# Patient Record
Sex: Female | Born: 1965 | Race: Black or African American | Hispanic: No | Marital: Single | Smoking: Current every day smoker
Health system: Southern US, Community
[De-identification: ages and names within clinical notes are randomized; demographics above are authoritative.]

## PROBLEM LIST (undated history)

## (undated) DIAGNOSIS — N760 Acute vaginitis: Secondary | ICD-10-CM

## (undated) DIAGNOSIS — D649 Anemia, unspecified: Secondary | ICD-10-CM

## (undated) DIAGNOSIS — B9689 Other specified bacterial agents as the cause of diseases classified elsewhere: Secondary | ICD-10-CM

## (undated) HISTORY — DX: Anemia, unspecified: D64.9

## (undated) HISTORY — PX: TUBAL LIGATION: SHX77

---

## 2003-05-25 ENCOUNTER — Emergency Department (HOSPITAL_COMMUNITY): Admission: EM | Admit: 2003-05-25 | Discharge: 2003-05-25 | Payer: Self-pay | Admitting: Emergency Medicine

## 2003-06-28 ENCOUNTER — Emergency Department (HOSPITAL_COMMUNITY): Admission: EM | Admit: 2003-06-28 | Discharge: 2003-06-29 | Payer: Self-pay

## 2004-04-28 ENCOUNTER — Emergency Department (HOSPITAL_COMMUNITY): Admission: EM | Admit: 2004-04-28 | Discharge: 2004-04-28 | Payer: Self-pay | Admitting: Emergency Medicine

## 2004-06-05 ENCOUNTER — Emergency Department (HOSPITAL_COMMUNITY): Admission: EM | Admit: 2004-06-05 | Discharge: 2004-06-05 | Payer: Self-pay | Admitting: Family Medicine

## 2004-10-05 ENCOUNTER — Emergency Department (HOSPITAL_COMMUNITY): Admission: EM | Admit: 2004-10-05 | Discharge: 2004-10-05 | Payer: Self-pay | Admitting: Family Medicine

## 2005-06-08 ENCOUNTER — Encounter: Payer: Self-pay | Admitting: Family Medicine

## 2005-06-08 ENCOUNTER — Encounter (INDEPENDENT_AMBULATORY_CARE_PROVIDER_SITE_OTHER): Payer: Self-pay | Admitting: Internal Medicine

## 2005-06-08 ENCOUNTER — Ambulatory Visit: Payer: Self-pay | Admitting: Family Medicine

## 2005-06-26 ENCOUNTER — Ambulatory Visit: Payer: Self-pay | Admitting: *Deleted

## 2005-11-14 ENCOUNTER — Ambulatory Visit: Payer: Self-pay | Admitting: Internal Medicine

## 2005-12-30 IMAGING — CT CT HEAD W/O CM
1 series · 16 of 30 positions shown, 20 images · non-contrast
Comparison: none

CLINICAL DATA: 38 year old female, headache, weakness.
CT HEAD WITHOUT CONTRAST ? 06/28/03
TECHNIQUE: Multidetector helical CT scanning obtained from the skull base to the vertex.

[Series 2: brain · axial · 0.47mm/px · z∈[+140,+274]mm · 16 of 30 slices shown, 20 images]
[im 2/30  brain]
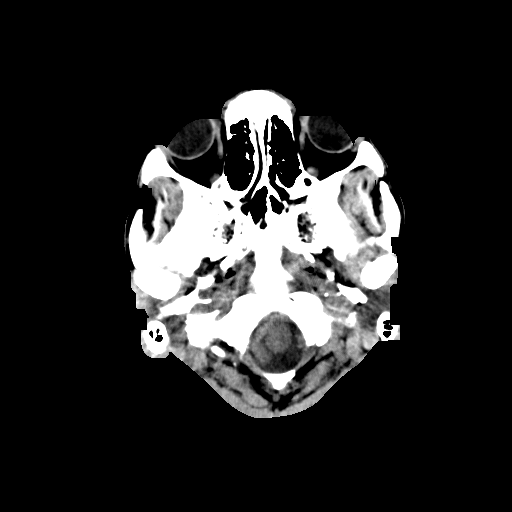
[im 2/30  bone]
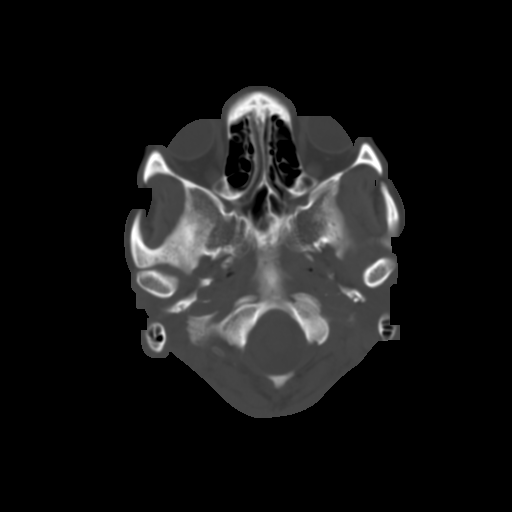
[im 4/30  brain]
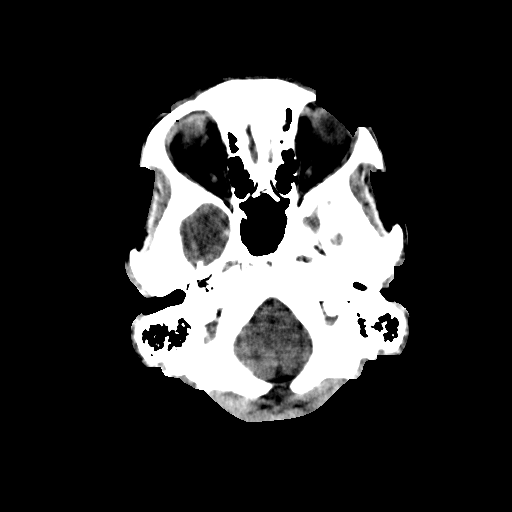
[im 6/30  brain]
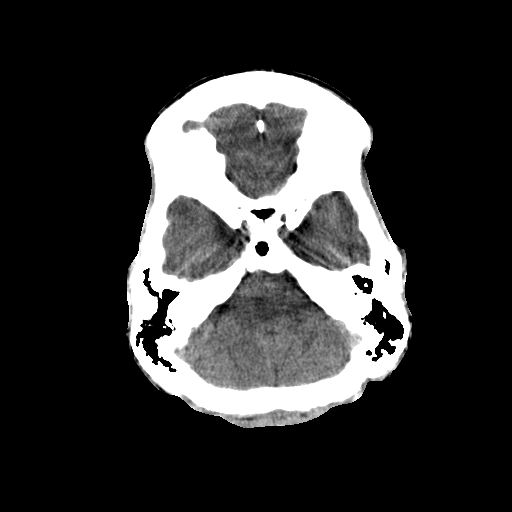
[im 8/30  brain]
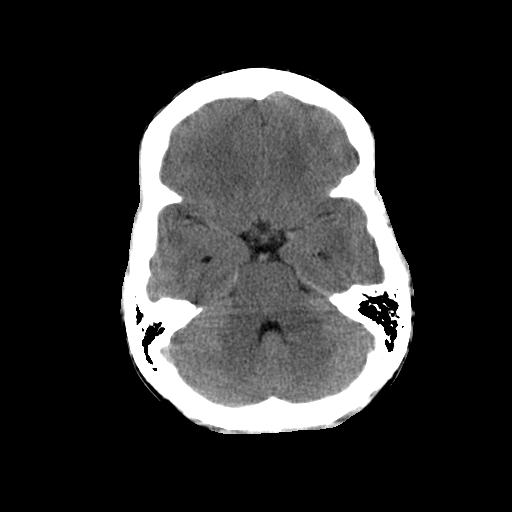
[im 9/30  brain]
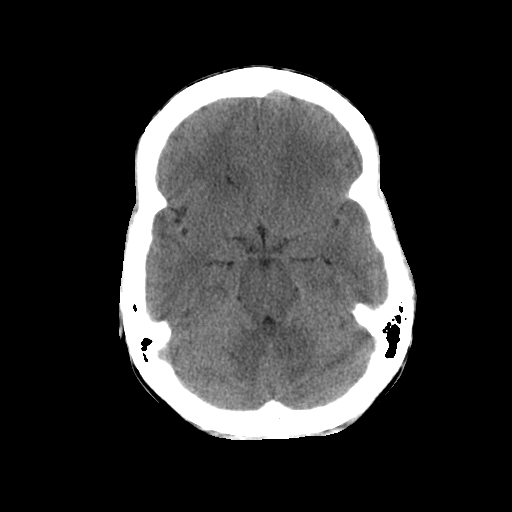
[im 9/30  bone]
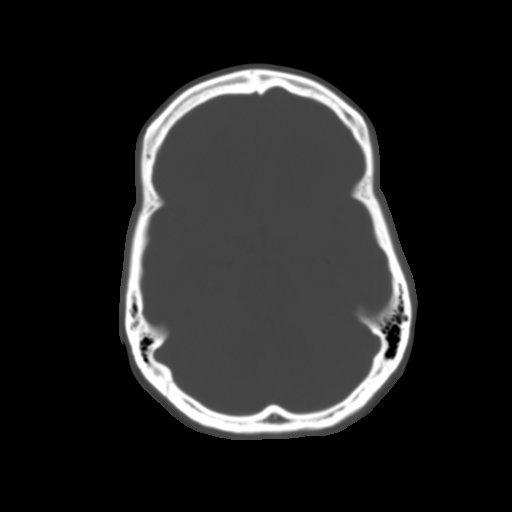
[im 11/30  brain]
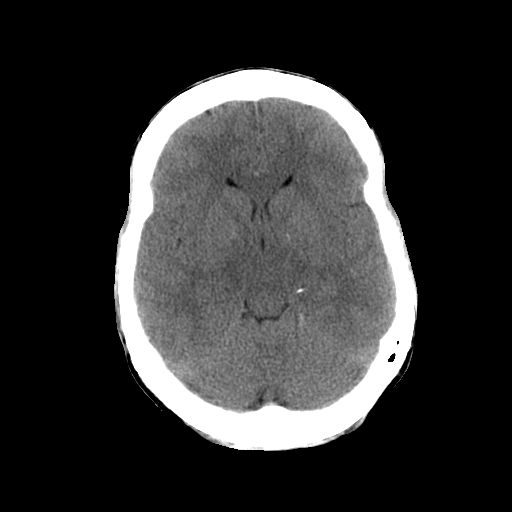
[im 13/30  brain]
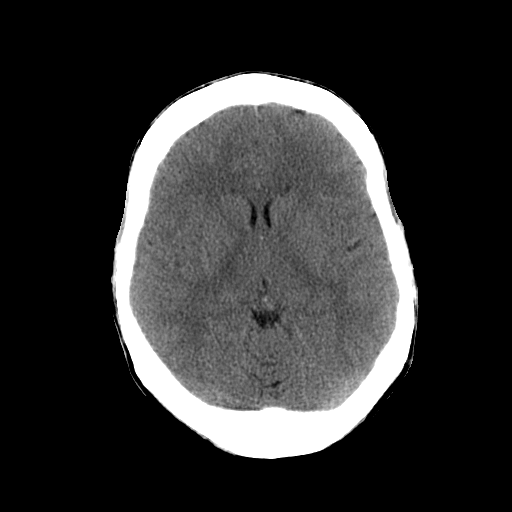
[im 15/30  brain]
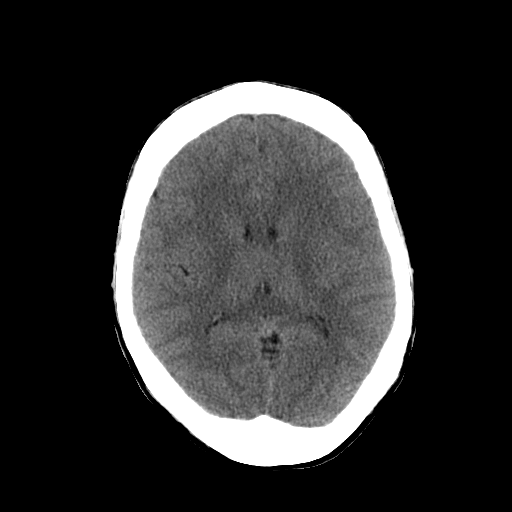
[im 16/30  brain]
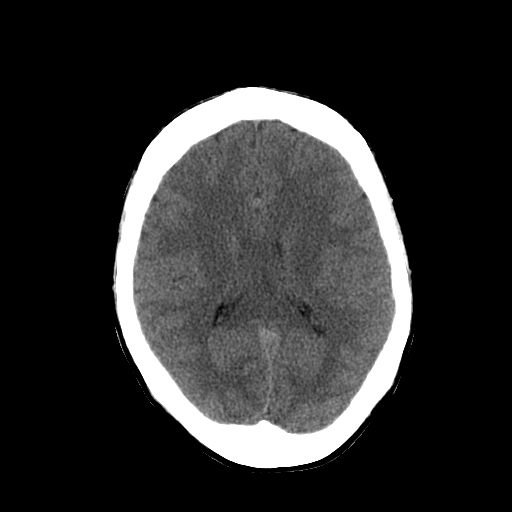
[im 16/30  bone]
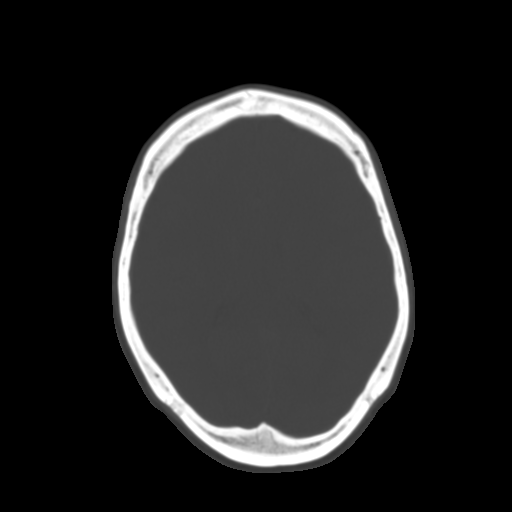
[im 18/30  brain]
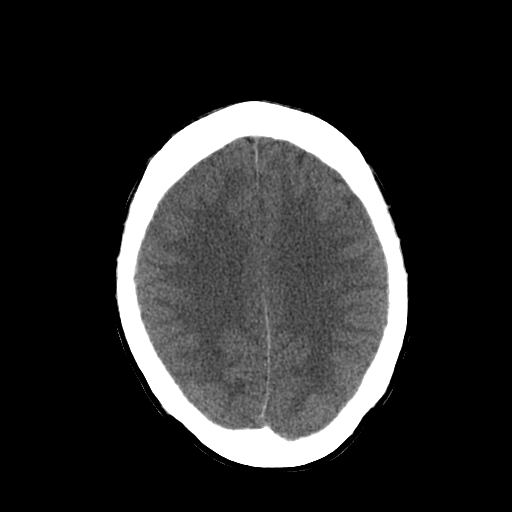
[im 20/30  brain]
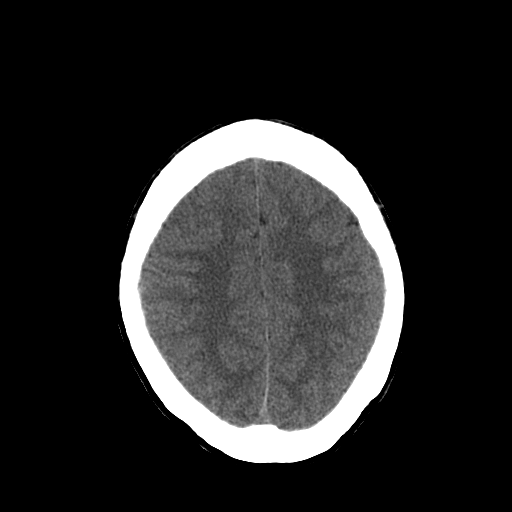
[im 22/30  brain]
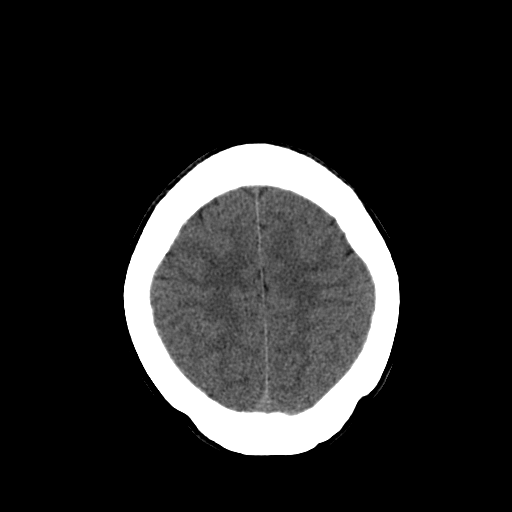
[im 23/30  brain]
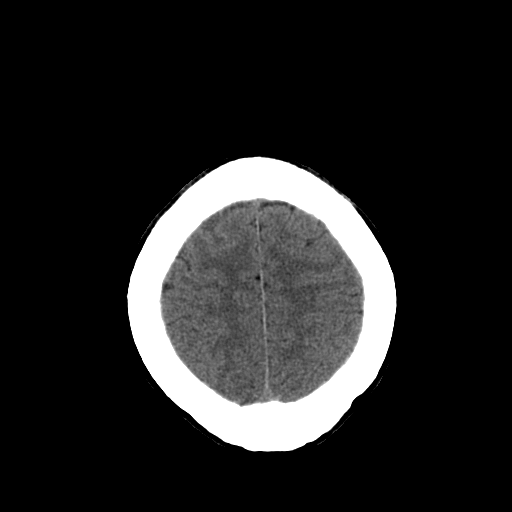
[im 23/30  bone]
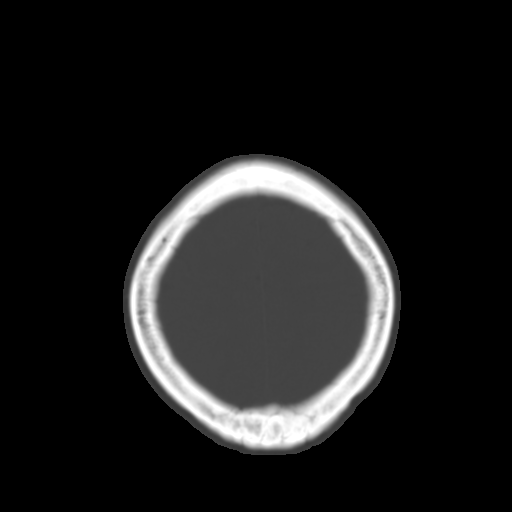
[im 25/30  brain]
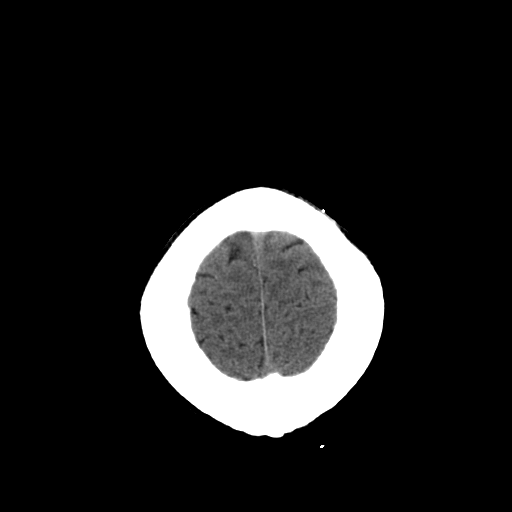
[im 27/30  brain]
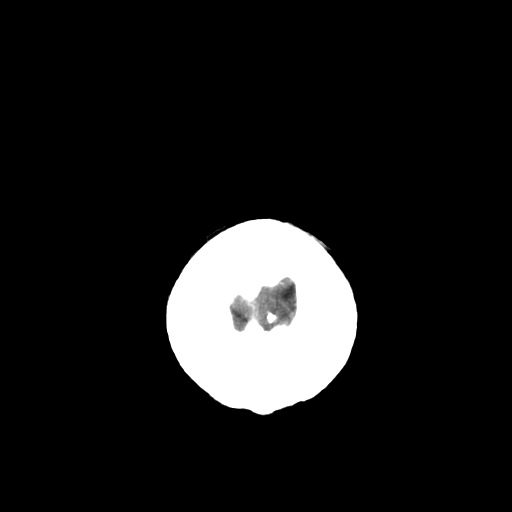
[im 29/30  brain]
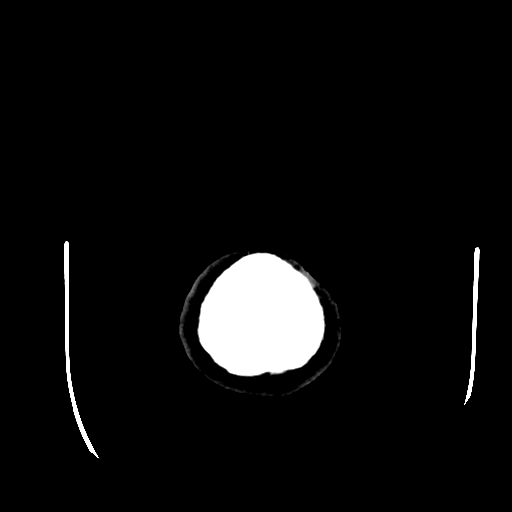

[16 of 30 positions shown; findings below may reference images not displayed]

FINDINGS: No acute intracranial abnormality is identified.  No evidence of mass or mass effect, hydrocephalus, extra-axial fluid collection, midline shift, hemorrhage, or infarct.  Acute infarct may be missed by CT for 24-48 hours.   Low lying cerebellar tonsils are noted.  The visualized bony calvarium and paranasal sinuses are unremarkable.
IMPRESSION
1.  No acute intracranial abnormality.
2.  Low lying cerebellar tonsils. Recommend further evaluation with MRI.

## 2006-04-20 ENCOUNTER — Emergency Department (HOSPITAL_COMMUNITY): Admission: EM | Admit: 2006-04-20 | Discharge: 2006-04-20 | Payer: Self-pay | Admitting: Emergency Medicine

## 2006-10-25 ENCOUNTER — Telehealth (INDEPENDENT_AMBULATORY_CARE_PROVIDER_SITE_OTHER): Payer: Self-pay | Admitting: Internal Medicine

## 2006-10-31 ENCOUNTER — Encounter (INDEPENDENT_AMBULATORY_CARE_PROVIDER_SITE_OTHER): Payer: Self-pay | Admitting: Internal Medicine

## 2006-10-31 DIAGNOSIS — F17200 Nicotine dependence, unspecified, uncomplicated: Secondary | ICD-10-CM

## 2006-11-02 DIAGNOSIS — Z8659 Personal history of other mental and behavioral disorders: Secondary | ICD-10-CM

## 2006-11-06 ENCOUNTER — Encounter (INDEPENDENT_AMBULATORY_CARE_PROVIDER_SITE_OTHER): Payer: Self-pay | Admitting: *Deleted

## 2008-10-05 ENCOUNTER — Emergency Department (HOSPITAL_COMMUNITY): Admission: EM | Admit: 2008-10-05 | Discharge: 2008-10-05 | Payer: Self-pay | Admitting: Family Medicine

## 2008-10-22 IMAGING — CR DG CHEST 2V
2 series · 2 of 2 positions shown · non-contrast
Comparison: none

CLINICAL DATA: Short of breath.
 CHEST ? 2 VIEW:

[w chest pa]
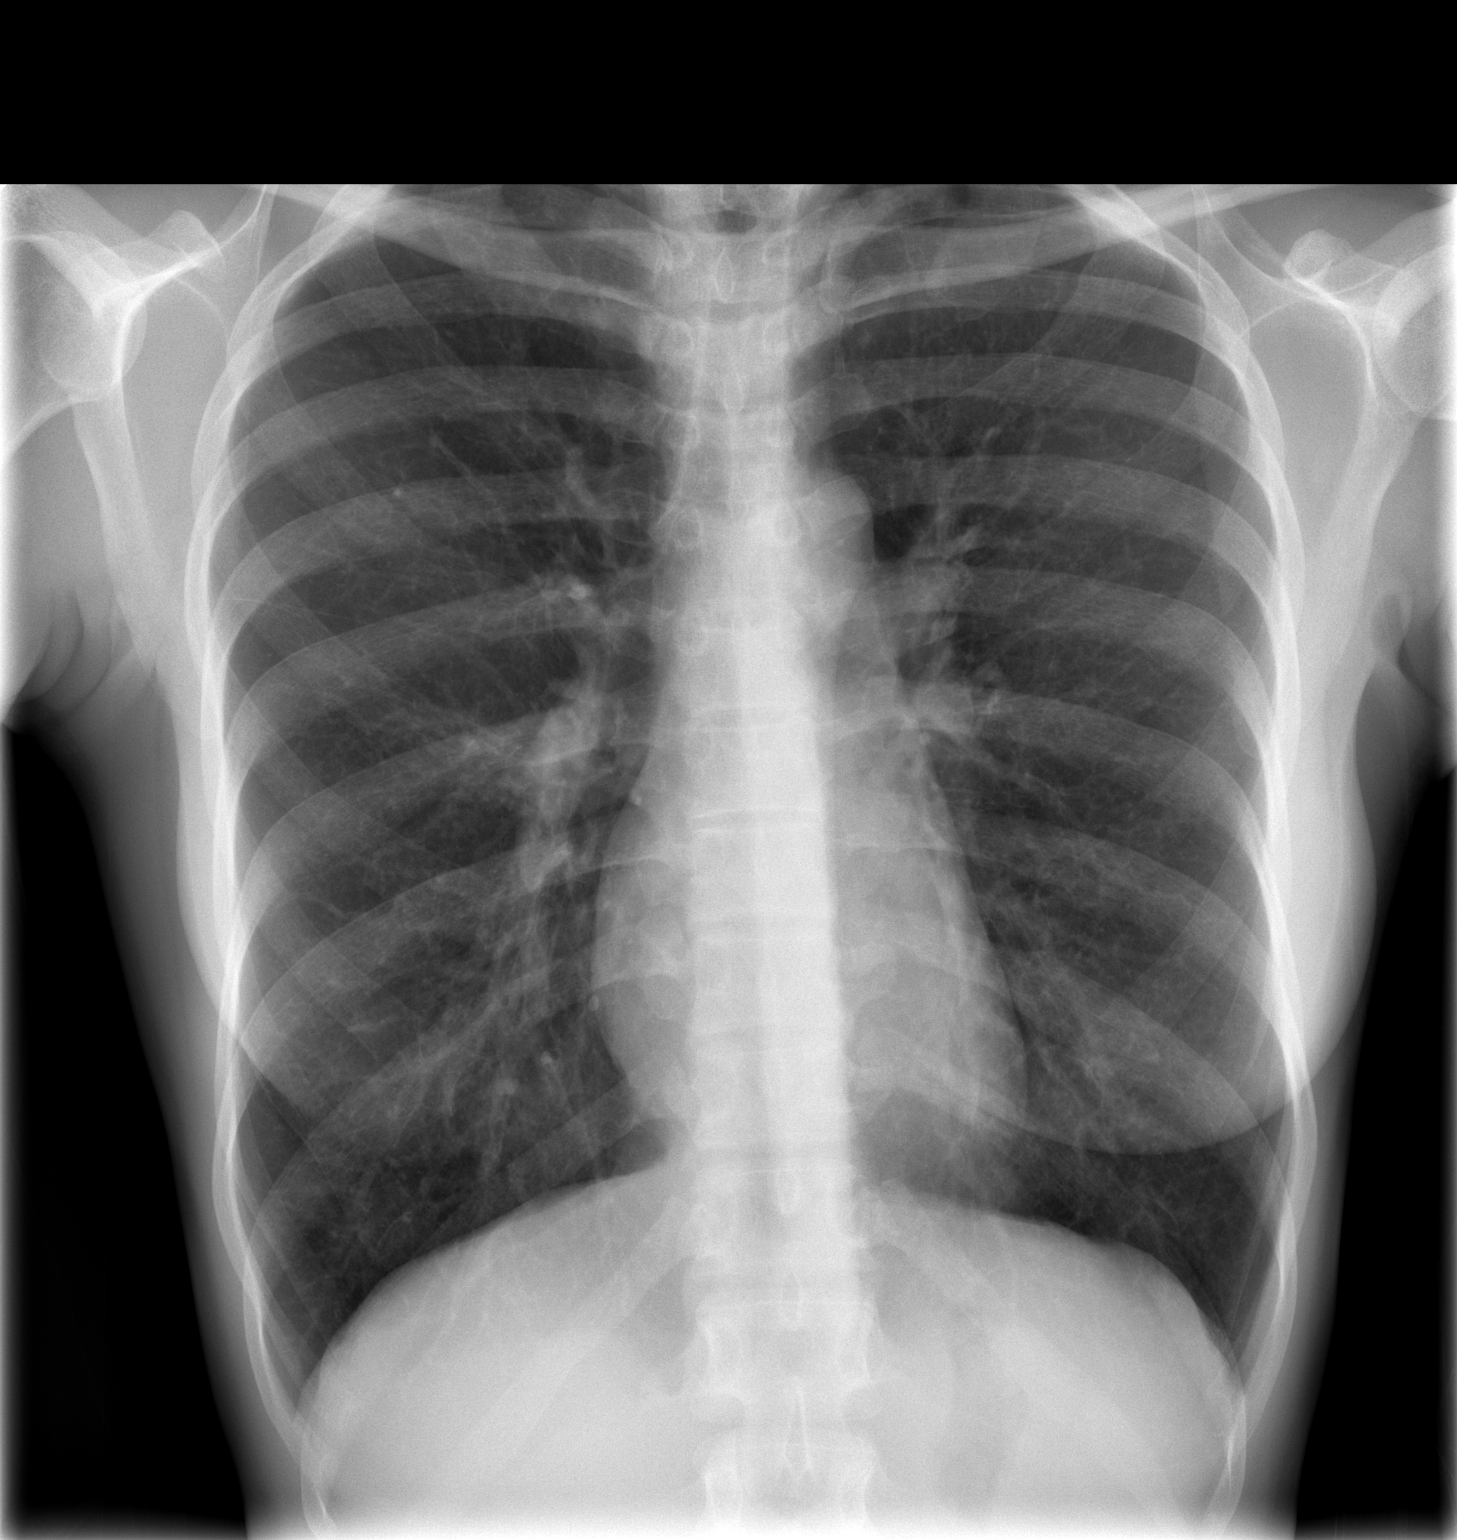

[w chest lat]
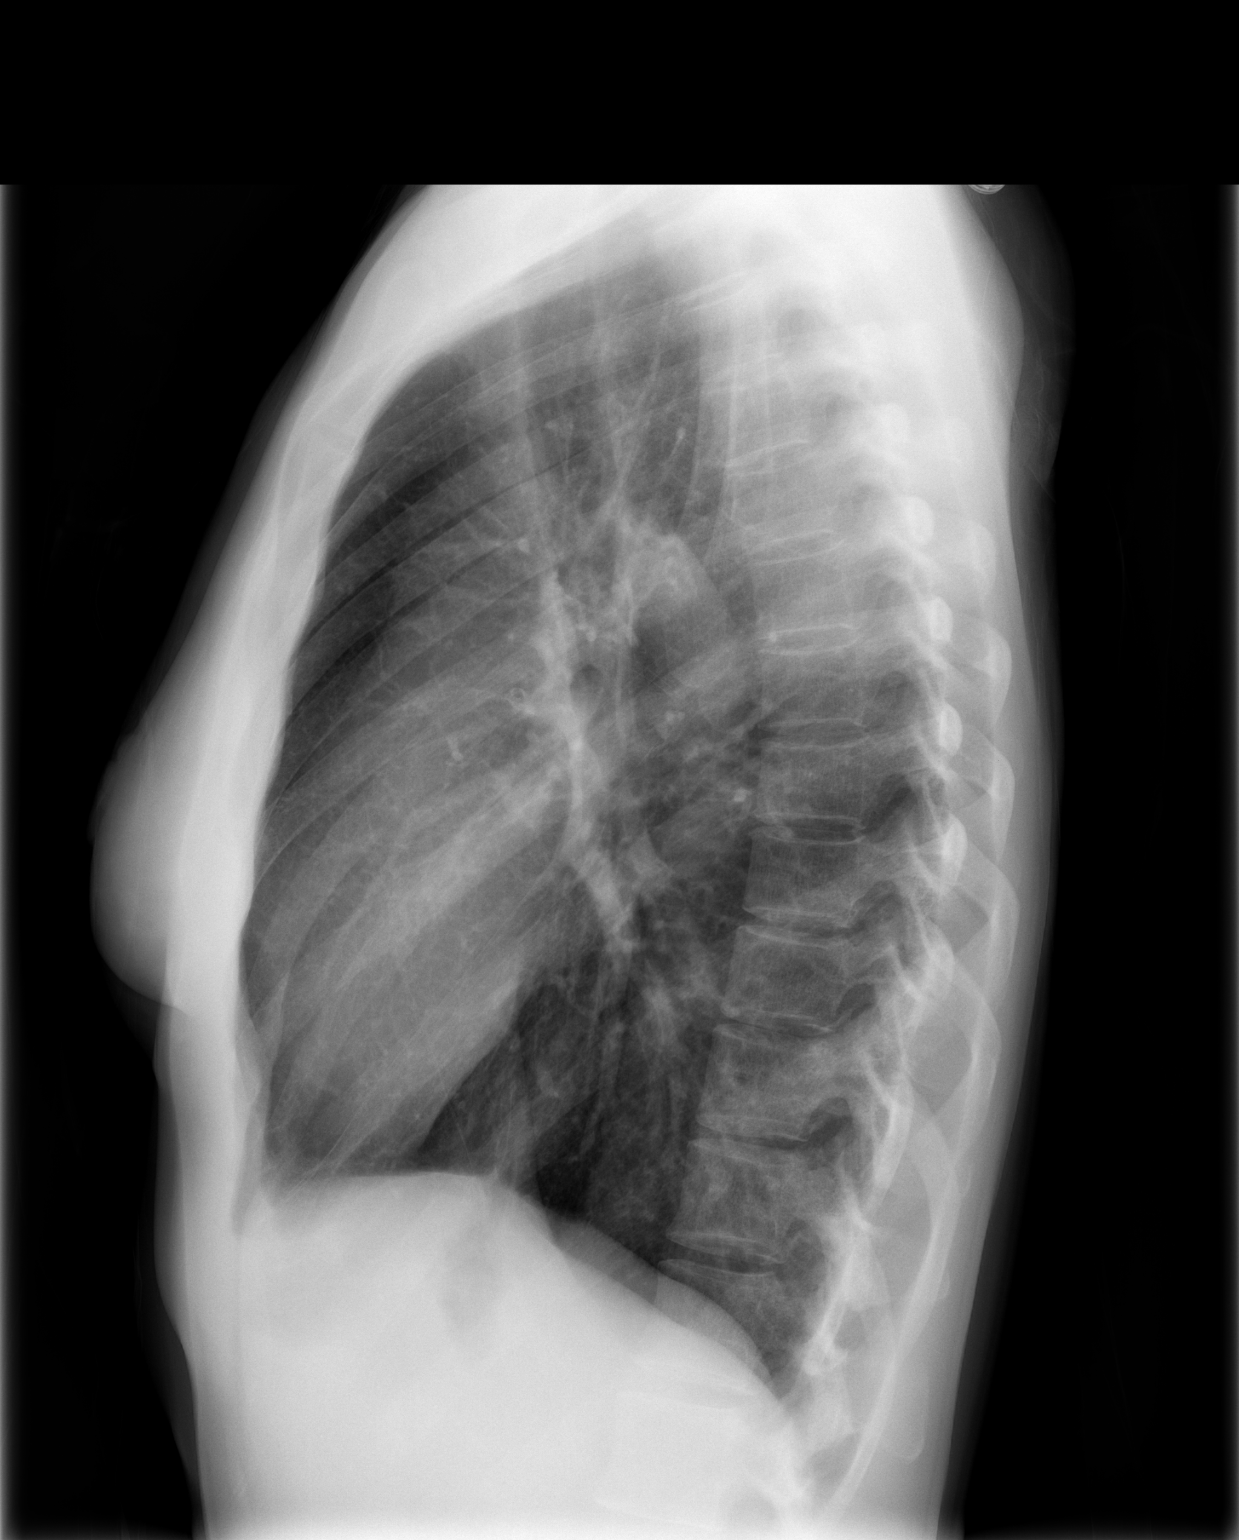

[2 of 2 positions shown; findings below may reference images not displayed]

FINDINGS: Bronchitic changes and hyperinflation are noted.  The heart is normal in size.  No consolidation.  No pneumothorax.
IMPRESSION: Hyperinflation and bronchitic changes.

## 2010-04-27 ENCOUNTER — Encounter (INDEPENDENT_AMBULATORY_CARE_PROVIDER_SITE_OTHER): Payer: Self-pay | Admitting: Nurse Practitioner

## 2010-04-27 ENCOUNTER — Encounter: Payer: Self-pay | Admitting: Nurse Practitioner

## 2010-04-27 DIAGNOSIS — L84 Corns and callosities: Secondary | ICD-10-CM | POA: Insufficient documentation

## 2010-04-27 DIAGNOSIS — K2970 Gastritis, unspecified, without bleeding: Secondary | ICD-10-CM | POA: Insufficient documentation

## 2010-04-27 DIAGNOSIS — K2990 Gastroduodenitis, unspecified, without bleeding: Secondary | ICD-10-CM | POA: Insufficient documentation

## 2010-04-27 DIAGNOSIS — K649 Unspecified hemorrhoids: Secondary | ICD-10-CM | POA: Insufficient documentation

## 2010-05-02 NOTE — Assessment & Plan Note (Signed)
Summary: Hemorrhoids   Vital Signs:  Patient profile:   45 year old female LMP:     03/16/2010 Weight:      114.3 pounds Temp:     98.2 degrees F oral Pulse rate:   87 / minute Pulse rhythm:   regular Resp:     20 per minute BP sitting:   92 / 62  (left arm) Cuff size:   regular  Vitals Entered By: Levon Hedger (April 27, 2010 2:25 PM) CC: stomach issue from hospital visit (gastritis)...Marland Kitchenhemrrhoid flare....callous on the bottom of foot Is Patient Diabetic? No Pain Assessment Patient in pain? no       Does patient need assistance? Functional Status Self care Ambulation Normal LMP (date): 03/16/2010     Enter LMP: 03/16/2010 Last PAP Result Normal   CC:  stomach issue from hospital visit (gastritis)...Marland Kitchenhemrrhoid flare....callous on the bottom of foot.  History of Present Illness:  Pt into the office to re-establish care. Pt not seen in over 2 years. Last hospital visit 10/05/2008 and pt was dx with gastritis.  She was not able to get the prescribed meds so she took over the counter prilosec which helped. She has changed diet to avoid citris foods, chocolate. Pt admits that back in 2010 she was drinking in excess but she has since decreased her drinking.  Pt reports that she has been to the health [facility] and dx with BV. started on metronidazole, which gave her a yeast infection and she took a medication. Pt reports that she also has some rectal itching - tried preparation H without resolution of the symtoms S/P  5 natural deliveries Reports that her stools are soft, denies any straining Denies any change in sexual relations - partner since the past 6 years  Callous - left foot; Pt has been using multple OTC preparations withtout resolutions of symptoms. ongoing for the past 1.5 years.  painful when walking  Habits & Providers  Alcohol-Tobacco-Diet     Alcohol drinks/day: <1     Alcohol Counseling: to decrease amount and/or frequency of alcohol intake  Alcohol type: wine     Tobacco Status: current     Tobacco Counseling: to quit use of tobacco products     Cigarette Packs/Day: 0.25  Exercise-Depression-Behavior     Does Patient Exercise: no     Have you felt down or hopeless? no     Have you felt little pleasure in things? no     Drug Use: never  Medications Prior to Update: 1)  None  Allergies (verified): No Known Drug Allergies  Social History: Smoking Status:  current Packs/Day:  0.25 Drug Use:  never Does Patient Exercise:  no  Review of Systems General:  Denies loss of appetite. CV:  Denies chest pain or discomfort. Resp:  Denies cough. GI:  Complains of hemorrhoids; denies abdominal pain, nausea, and vomiting.  Physical Exam  General:  alert.   Head:  normocephalic.   Eyes:  glasses Ears:  ear piercing(s) noted.   Neurologic:  alert & oriented X3.   Skin:  left foot - under 4th metatarsal; callous Psych:  Oriented X3.     Impression & Recommendations:  Problem # 1:  GASTRITIS (ICD-535.50)  Pt has decreased her ETOH use handout given on foods to avoid  Her updated medication list for this problem includes:    Prilosec Otc 20 Mg Tbec (Omeprazole magnesium) ..... One capsule by mouth daily for stomach  Problem # 2:  HEMORRHOIDS (ICD-455.6) cream  ordered will better assess during next CPE exam  Problem # 3:  TOBACCO ABUSE (ICD-305.1) advised cessation  Problem # 4:  CORNS AND CALLUSES (ICD-700)  Orders: Podiatry Referral (Podiatry)  Complete Medication List: 1)  Prilosec Otc 20 Mg Tbec (Omeprazole magnesium) .... One capsule by mouth daily for stomach 2)  Anusol-hc 2.5 % Crea (Hydrocortisone) .... Apply to affected area two times a day as needed for itching  Patient Instructions: 1)  Schedule an appointment for a complete physical exam 2)  Come fasting before this visit.  No food after midnight before this visit.  You will need PAP, rectal exam, Labs, vision, mammogram schedule 3)  You will be  referred to podiatry for your foot 4)  Hemorrhoids - be sure to keep your stools soft.  Will send [facility]) Prescriptions: ANUSOL-HC 2.5 % CREA (HYDROCORTISONE) Apply to affected area two times a day as needed for itching  #30gm x 0   Entered and Authorized by:   Lehman Prom FNP   Signed by:   Lehman Prom FNP on 04/27/2010   Method used:   Print then Give to Patient   RxID:   [id number]    Orders Added: 1)  Est. Patient Level IV [13086] 2)  Podiatry Referral [Podiatry]  Appended Document: Hemorrhoids    Clinical Lists Changes  Medications: Rx of ANUSOL-HC 2.5 % CREA (HYDROCORTISONE) Apply to affected area two times a day as needed for itching;  #30gm x 0;  Signed;  Entered by: Lehman Prom FNP;  Authorized by: Lehman Prom FNP;  Method used: Faxed to [facility], [address], [place], [place]  [address], Ph: [phone], Fax: [phone]    Prescriptions: ANUSOL-HC 2.5 % CREA (HYDROCORTISONE) Apply to affected area two times a day as needed for itching  #30gm x 0   Entered and Authorized by:   Lehman Prom FNP   Signed by:   Lehman Prom FNP on 04/27/2010   Method used:   Faxed to ...       [facility] (retail)       [address] [place], [place]  [address]       Ph: [phone]       Fax: [phone]   RxID:   [id number]

## 2010-05-02 NOTE — Letter (Signed)
Summary: Handout Printed  Printed Handout:  - Hemorrhoids 

## 2010-05-15 ENCOUNTER — Encounter (INDEPENDENT_AMBULATORY_CARE_PROVIDER_SITE_OTHER): Payer: Self-pay | Admitting: Internal Medicine

## 2010-05-23 NOTE — Letter (Signed)
Summary: PODIATRY NOTES  PODIATRY NOTES   Imported By: Arta Bruce 05/16/2010 14:56:55  _____________________________________________________________________  External Attachment:    Type:   Image     Comment:   External Document

## 2010-05-27 LAB — RAPID URINE DRUG SCREEN, HOSP PERFORMED
Amphetamines: NOT DETECTED
Benzodiazepines: NOT DETECTED
Cocaine: NOT DETECTED
Opiates: NOT DETECTED
Tetrahydrocannabinol: POSITIVE — AB

## 2010-05-27 LAB — HEPATIC FUNCTION PANEL
ALT: 10 U/L (ref 0–35)
AST: 17 U/L (ref 0–37)
Total Protein: 6.7 g/dL (ref 6.0–8.3)

## 2010-05-27 LAB — POCT I-STAT, CHEM 8
Calcium, Ion: 1.14 mmol/L (ref 1.12–1.32)
Creatinine, Ser: 0.9 mg/dL (ref 0.4–1.2)
HCT: 40 % (ref 36.0–46.0)
Sodium: 137 mEq/L (ref 135–145)
TCO2: 23 mmol/L (ref 0–100)

## 2010-05-27 LAB — SEDIMENTATION RATE: Sed Rate: 9 mm/hr (ref 0–22)

## 2010-06-23 ENCOUNTER — Other Ambulatory Visit: Payer: Self-pay | Admitting: Family Medicine

## 2010-07-07 NOTE — Consult Note (Signed)
Vicki Andrews, BHULLAR                            ACCOUNT NO.:  [id number]   MEDICAL RECORD NO.:  [id number]                   PATIENT TYPE:  EMS   LOCATION:  [facility]                                 FACILITY:  [facility]   PHYSICIAN:  Michael L. Thad Ranger, M.D.           DATE OF BIRTH:  1965-11-23   DATE OF CONSULTATION:  06/28/2003  DATE OF DISCHARGE:  06/29/2003                                   CONSULTATION   REQUESTING PHYSICIAN:  Lorre Nick, M.D.,  [facility].   REASON FOR CONSULTATION:  Headache and left-sided weakness.   HISTORY OF PRESENT ILLNESS:  This is the initial emergency room consultation  evaluation of this 45 year old woman with past medical history which  includes migraine and reported stroke in the past.  The patient reports at  about 7:30 in the evening yesterday the onset of severe headache which is  left-sided, throbbing, and associated with nausea and dry heaves.  Shortly  after that, she noted the left side felt numb and weak in the arm and leg  and noted that her speech became slurred.  She came to the emergency room  where her symptoms improved after receiving pain medications but not  resolved.  She says that she had similar symptoms about six years ago after  the delivery of her first child and was actually diagnosed with a stroke at  that time.  CT of the head was negative, and neurologic consultation was  requested.   PAST MEDICAL HISTORY:  1. Frequent chronic headaches with migrainous nature.  2. Asthma.  3. The reported stroke episode as above.   SOCIAL HISTORY:  She does smoke and continues to use alcohol.  She also  occasionally, according to the ER chart, uses marijuana.  She reports just  having moved to the area and does not have a primary physician.   FAMILY HISTORY:  Noncontributory.   ALLERGIES:  No known drug allergies.   MEDICATIONS:  Albuterol inhaler.   REVIEW OF SYSTEMS:  Remarkable for the numbness and weakness, nausea,  and  slurred speech as above, as well as headache as above.  Remainder 10-system  of Review of Systems negative except as outlined in ER records.   PHYSICAL EXAMINATION:  VITAL SIGNS:  Temperature 98.4, blood pressure  123/75, pulse 76, respirations 12.  GENERAL:  She is alert in no evident distress, a little bit indifferent to  her situation.  She has an unusual, slow, forced speech pattern which is  slurred but not truly dysarthric.  Her speech is appropriate in content.  She is able to name objects.  HEENT:  Head: Cranium normocephalic and atraumatic.  Oropharynx benign.  NECK:  Supple without carotid bruits.  HEART:  Regular rate and rhythm without murmurs.  NEUROLOGIC:  Funduscopic exam benign.  Pupils equal and briskly reactive.  Extraocular movements full without nystagmus.  Visual fields full to  confrontation.  Hearing  is intact to conversational speech.  Facial  sensation is intact to pinprick.  Face, tongue, and palate move normally and  symmetrically.  Shoulder shrug strength is normal.  Motor testing:  Normal  bulk and tone.  Strength on the right is normal.  On the left, there is  giveaway but at least brief full strength can be generated in all major  extremity muscle groups and giveaway weakness is present. Sensation intact  to light touch and pinprick all extremities.  Coordination: Performed slow  on the left.  Cerebellar exam demonstrates no true dysmetria.  Gait exam  deferred.  Reflexes 2+ and symmetric.  Toes are downgoing.   LABORATORY AND X-RAY REVIEW:  CMET is unremarkable except for minimally low  potassium at 3.4.  CBC is unremarkable.  Coags are normal.   CT of the head is personally reviewed and is normal except for question of  somewhat low-lying __________possible Chiari malformation.   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  Symptoms are improving as her headache has improved.  She does have exam  findings which are rather  exaggerated.   RECOMMENDATIONS:  Would advance this expectantly.  If symptoms are not  resolved by the morning, it would be appropriate to check an MRI.  I suggest  that she take an aspirin a day for prophylaxis and follow up in our office  for consideration of migraine prophylaxis which hopefully would reduce the  number of total headaches and headaches associated with a neurologic  deficit.  She also needs to establish a primary care physician.                                               Michael L. Thad Ranger, M.D.    MLR/MEDQ  D:  06/29/2003  T:  06/29/2003  Job:  [id number]

## 2011-07-06 ENCOUNTER — Encounter (HOSPITAL_COMMUNITY): Payer: Self-pay | Admitting: Emergency Medicine

## 2011-07-06 ENCOUNTER — Emergency Department (INDEPENDENT_AMBULATORY_CARE_PROVIDER_SITE_OTHER)
Admission: EM | Admit: 2011-07-06 | Discharge: 2011-07-06 | Disposition: A | Discharge status: Home / Self Care | Payer: Self-pay | Source: Home / Self Care | Attending: Emergency Medicine | Admitting: Emergency Medicine

## 2011-07-06 DIAGNOSIS — W57XXXA Bitten or stung by nonvenomous insect and other nonvenomous arthropods, initial encounter: Secondary | ICD-10-CM

## 2011-07-06 DIAGNOSIS — B356 Tinea cruris: Secondary | ICD-10-CM

## 2011-07-06 DIAGNOSIS — T148 Other injury of unspecified body region: Secondary | ICD-10-CM

## 2011-07-06 MED ORDER — HYDROXYZINE HCL 25 MG PO TABS: 25.0000 mg | ORAL_TABLET | Freq: Four times a day (QID) | ORAL | Status: AC | PRN

## 2011-07-06 MED ORDER — PREDNISONE 20 MG PO TABS: 60.0000 mg | ORAL_TABLET | Freq: Every day | ORAL | Status: AC

## 2011-07-06 MED ORDER — FLUCONAZOLE 150 MG PO TABS: ORAL_TABLET | ORAL | Status: AC

## 2011-07-06 NOTE — ED Notes (Addendum)
Pt stated she was diagnosed with eczema on her back and abdomen about a year ago. She states the medicine given never really helped much and now the rash has spread to her genital area. Pt also has a new rash that is red raised bumps that both itch and hurt on all extremeties. These bumps started about a week ago and no OTC meds that she has tried have helped.

## 2011-07-06 NOTE — Discharge Instructions (Signed)
You may continue the Lamisil, but you needs to apply in one to 2 times a day until the rash resolves and for one week thereafter. The Diflucan should take care of the infection entirely. Return if you've a fever above 100.4, if you get worse, or for any other concerns

## 2011-07-08 NOTE — ED Provider Notes (Signed)
History     CSN: [id number]  Arrival date & time 07/06/11  1254   First MD Initiated Contact with Patient 07/06/11 1301      Chief Complaint  Patient presents with  . Rash    (Consider location/radiation/quality/duration/timing/severity/associated sxs/prior treatment) HPI Comments: Patient reports a flat, scaly, itchy, dry rash in her groin starting several months ago. Started in the right groin, and now has spread over the mons pubis. States that it has been diagnosed as eczema in the past, and she has tried topical steroids without relief. She also tried Lotrimin and with some improvement, but the rash returned when she stopped using it. No nausea, vomiting, fevers, blisters, purulent drainage. Rash is itchy all day. She doesn't have this rash anywhere else.    Patient also reports intensely itchy, painful erythematous, papular rash over her arms, legs, and torso starting several days ago. States she stayed over at a relative's house, and felt like she was being bitten "all night long". There was no dog or cat present in the house. She does not know if anybody else in the house has a similar rash. She has tried multiple OTC anti-itch preparations including hydrocortisone 1% and goals Bond without relief. Rash has not spread since she stopped staying at her relatives house. No new lotions, soaps, detergents. No recent medications.    ROS as noted in HPI. All other ROS negative.   Patient is a 46 y.o. female presenting with rash. The history is provided by the patient. No language interpreter was used.  Rash     Past Medical History  Diagnosis Date  . Asthma     Past Surgical History  Procedure Date  . Btl     History reviewed. No pertinent family history.  History  Substance Use Topics  . Smoking status: Current Everyday Smoker  . Smokeless tobacco: Not on file  . Alcohol Use: Yes    OB History    Grav Para Term Preterm Abortions TAB SAB Ect Mult Living        Review of Systems  Skin: Positive for rash.    Allergies  Review of patient's allergies indicates no known allergies.  Home Medications   Current Outpatient Rx  Name Route Sig Dispense Refill  . ALBUTEROL SULFATE HFA 108 (90 BASE) MCG/ACT IN AERS Inhalation Inhale 2 puffs into the lungs every 6 (six) hours as needed.    Marland Kitchen CLOTRIMAZOLE 1 % EX CREA Topical Apply topically 2 (two) times daily.    Marland Kitchen HYDROCORTISONE 1 % EX LOTN Topical Apply topically 2 (two) times daily.    Marland Kitchen GOLD BOND EX Apply externally Apply topically.    Marland Kitchen FLUCONAZOLE 150 MG PO TABS  1 tab po once a week for 4 weeks 4 tablet 0  . HYDROXYZINE HCL 25 MG PO TABS Oral Take 1 tablet (25 mg total) by mouth every 6 (six) hours as needed for itching. 20 tablet 0  . PREDNISONE 20 MG PO TABS Oral Take 3 tablets (60 mg total) by mouth daily. 15 tablet 0    BP 91/55  Pulse 90  Temp(Src) 98.1 F (36.7 C) (Oral)  Resp 16  SpO2 99%  LMP 06/28/2011  Physical Exam  Nursing note and vitals reviewed. Constitutional: She is oriented to person, place, and time. She appears well-developed and well-nourished. No distress.  HENT:  Head: Normocephalic and atraumatic.  Eyes: Conjunctivae and EOM are normal.  Neck: Normal range of motion.  Cardiovascular: Normal rate.  Pulmonary/Chest: Effort normal.  Abdominal: She exhibits no distension.  Genitourinary:       Flat, scaly oval rash in right groin consistent with tinea. Similar rash over mons pubis. No papules, pustules, vesicles. No signs of infection. No other similar rash anywhere else.  Musculoskeletal: Normal range of motion.  Neurological: She is alert and oriented to person, place, and time.  Skin: Skin is warm and dry. Rash noted. Rash is papular.       Scattered large erythematous papules on arms, legs, some on torso, consistent with insect bites. No Burrows between fingers. No rash on palms of hands, right face.   Psychiatric: She has a normal mood and affect. Her  behavior is normal. Judgment and thought content normal.    ED Course  Procedures (including critical care time)  Labs Reviewed - No data to display No results found.   1. Bedbug bite   2. Tinea cruris       MDM  Rash in the groin appears to be tinea cruris, and her other rash appears to be bedbug bite. No evidence of infection at this time. Will send home with hydroxyzine, prednisone for the bedbug bites.  Diflucan once weekly for a month for the tinea. We'll have her continue the Lotrimin. She is to followup with her current care physician as needed. Patient agrees with plan.  Luiz Blare, MD 07/08/11 2131

## 2011-11-29 ENCOUNTER — Emergency Department (INDEPENDENT_AMBULATORY_CARE_PROVIDER_SITE_OTHER)
Admission: EM | Admit: 2011-11-29 | Discharge: 2011-11-29 | Disposition: A | Discharge status: Home / Self Care | Payer: Self-pay | Source: Home / Self Care | Attending: Emergency Medicine | Admitting: Emergency Medicine

## 2011-11-29 ENCOUNTER — Encounter (HOSPITAL_COMMUNITY): Payer: Self-pay | Admitting: Emergency Medicine

## 2011-11-29 DIAGNOSIS — K6289 Other specified diseases of anus and rectum: Secondary | ICD-10-CM

## 2011-11-29 DIAGNOSIS — K644 Residual hemorrhoidal skin tags: Secondary | ICD-10-CM

## 2011-11-29 MED ORDER — HYDROCORTISONE ACETATE 25 MG RE SUPP: 25.0000 mg | Freq: Two times a day (BID) | RECTAL | Status: DC

## 2011-11-29 MED ORDER — TRAMADOL HCL 50 MG PO TABS: 50.0000 mg | ORAL_TABLET | Freq: Four times a day (QID) | ORAL | Status: DC | PRN

## 2011-11-29 MED ORDER — HYDROCODONE-ACETAMINOPHEN 5-325 MG PO TABS
ORAL_TABLET | ORAL | Status: AC
  Filled 2011-11-29: qty 1

## 2011-11-29 MED ORDER — LIDOCAINE HCL 2 % EX GEL: CUTANEOUS | Status: DC | PRN

## 2011-11-29 MED ORDER — HYDROCODONE-ACETAMINOPHEN 5-325 MG PO TABS
1.0000 | ORAL_TABLET | Freq: Once | ORAL | Status: AC
  Administered 2011-11-29: 1 via ORAL

## 2011-11-29 MED ORDER — LIDOCAINE VISCOUS 2 % MT SOLN
20.0000 mL | Freq: Once | OROMUCOSAL | Status: AC
  Administered 2011-11-29: 20 mL via OROMUCOSAL

## 2011-11-29 NOTE — ED Notes (Signed)
Reports history of hemorrhoids.  Reports having issues for 2 1/2 months.  Has used creams and home methods.  Sometimes these methods work and sometimes they don't.  This time, these methods are not helping.  A month ago seen at [facility] department for bv and they suggested having surgery.

## 2011-11-29 NOTE — ED Notes (Signed)
Provided patient with graham crackers, offered other snacks, graham crackers were patients choice.

## 2011-11-29 NOTE — ED Provider Notes (Signed)
History     CSN: [id number]  Arrival date & time 11/29/11  1129   First MD Initiated Contact with Patient 11/29/11 1237      Chief Complaint  Patient presents with  . Hemorrhoids    (Consider location/radiation/quality/duration/timing/severity/associated sxs/prior treatment) The history is provided by the patient.   Patient presents for evaluation of sharp rectal pain with no associated rectal bleeding.  Denies straining with defecation or maroon stools.  The patient denies n/v/d, abdominal pain, symptoms of dehydration.  The patient no known history of PUD, liver disease, cirrhosis, IBD, bleeding disorders and no history of taking NSAIDS/Coumadin. The patient has had intermittent pain for one year increasingly worse in the past week.  There is not a history of rectal injury/denies anal intercourse. Patient has had similar episodes of rectal pain in the past she has been treating with OTC preps as well as sitz baths-mild improvement.  Family history: crohn disease (father).  Past Medical History  Diagnosis Date  . Asthma     Past Surgical History  Procedure Date  . Btl   . Tubal ligation     No family history on file.  History  Substance Use Topics  . Smoking status: Current Every Day Smoker  . Smokeless tobacco: Not on file  . Alcohol Use: Yes    OB History    Grav Para Term Preterm Abortions TAB SAB Ect Mult Living                  Review of Systems  Constitutional: Negative.   Respiratory: Negative.   Cardiovascular: Negative.   Gastrointestinal: Positive for rectal pain. Negative for nausea, vomiting, abdominal pain, diarrhea, constipation, blood in stool, abdominal distention and anal bleeding.  Genitourinary: Negative.   Skin: Negative.     Allergies  Review of patient's allergies indicates no known allergies.  Home Medications   Current Outpatient Rx  Name Route Sig Dispense Refill  . ONE-DAILY MULTI VITAMINS PO TABS Oral Take 1 tablet by mouth  daily.    . ALBUTEROL SULFATE HFA 108 (90 BASE) MCG/ACT IN AERS Inhalation Inhale 2 puffs into the lungs every 6 (six) hours as needed.    Marland Kitchen CLOTRIMAZOLE 1 % EX CREA Topical Apply topically 2 (two) times daily.    Marland Kitchen HYDROCORTISONE ACETATE 25 MG RE SUPP Rectal Place 1 suppository (25 mg total) rectally 2 (two) times daily. 12 suppository 0  . HYDROCORTISONE 1 % EX LOTN Topical Apply topically 2 (two) times daily.    Marland Kitchen LIDOCAINE HCL 2 % EX GEL Topical Apply topically as needed. 30 mL 2  . GOLD BOND EX Apply externally Apply topically.    . TRAMADOL HCL 50 MG PO TABS Oral Take 1 tablet (50 mg total) by mouth every 6 (six) hours as needed for pain. 15 tablet 0    BP 104/57  Pulse 75  Temp 98.7 F (37.1 C) (Oral)  Resp 16  SpO2 98%  LMP 11/29/2011  Physical Exam  Nursing note and vitals reviewed. Constitutional: She is oriented to person, place, and time. Vital signs are normal. She appears well-developed and well-nourished. She is active and cooperative.  HENT:  Head: Normocephalic.  Mouth/Throat: Oropharynx is clear and moist. No oropharyngeal exudate.  Eyes: Conjunctivae normal are normal. Pupils are equal, round, and reactive to light. No scleral icterus.  Neck: Trachea normal. Neck supple.  Cardiovascular: Normal rate, regular rhythm, normal heart sounds, intact distal pulses and normal pulses.   No murmur heard. Pulmonary/Chest:  Breath sounds normal. No respiratory distress.  Abdominal: Soft. Normal appearance. Bowel sounds are increased. There is tenderness in the suprapubic area and left lower quadrant. There is no CVA tenderness.  Genitourinary: Rectal exam shows external hemorrhoid and tenderness. Rectal exam shows no fissure and anal tone normal.    Pelvic exam was performed with patient supine.       Unable to palpate for internal hemorrhoids, pt unable to tolerate exam  Musculoskeletal: Normal range of motion.  Lymphadenopathy:    She has no cervical adenopathy.        Right: No inguinal adenopathy present.       Left: No inguinal adenopathy present.  Neurological: She is alert and oriented to person, place, and time. She has normal strength and normal reflexes. No cranial nerve deficit or sensory deficit. Coordination and gait normal. GCS eye subscore is 4. GCS verbal subscore is 5. GCS motor subscore is 6.  Skin: Skin is warm and dry.  Psychiatric: She has a normal mood and affect. Her speech is normal and behavior is normal. Judgment and thought content normal. Cognition and memory are normal.    ED Course  Procedures (including critical care time)  Labs Reviewed - No data to display No results found.   1. External hemorrhoids   2. Rectal pain       MDM  Hydrocodone and lidocaine gel administered in office.  Patient rx for rectal anusol and lidocaine.  Follow up with gastroenterology tomorrow for appointment for further evaluation and treatment of hemorrhoids.  Rtc sooner for symptoms as discussed.         Johnsie Kindred, NP 11/29/11 [phone]

## 2011-12-10 NOTE — ED Provider Notes (Signed)
Medical screening examination/treatment/procedure(s) were performed by non-physician practitioner and as supervising physician I was immediately available for consultation/collaboration.  Mackie Goon   Akil Hoos, MD 12/10/11 0815 

## 2012-01-01 ENCOUNTER — Telehealth (HOSPITAL_COMMUNITY): Payer: Self-pay | Admitting: *Deleted

## 2012-01-01 NOTE — ED Notes (Signed)
Pt. called and said she transferred her Lidocaine gel to the [facility] at [facility] but they told her we had to call.  I called the [facility] on [address] where she first filled the medication and confirmed she still had 2 refills. I called [facility] and they said they have contacted the other store and are waiting for them to confirm it. I called pt. and told her it was in process now.    Vassie Moselle 01/01/2012

## 2012-03-20 ENCOUNTER — Emergency Department (HOSPITAL_COMMUNITY)
Admission: EM | Admit: 2012-03-20 | Discharge: 2012-03-20 | Disposition: A | Discharge status: Home / Self Care | Payer: Medicaid Other | Source: Home / Self Care

## 2012-03-20 ENCOUNTER — Encounter (HOSPITAL_COMMUNITY): Payer: Self-pay | Admitting: Emergency Medicine

## 2012-03-20 DIAGNOSIS — K644 Residual hemorrhoidal skin tags: Secondary | ICD-10-CM

## 2012-03-20 MED ORDER — HYDROCORTISONE ACETATE 25 MG RE SUPP: 25.0000 mg | Freq: Two times a day (BID) | RECTAL | Status: DC

## 2012-03-20 MED ORDER — TRAMADOL HCL 50 MG PO TABS: 50.0000 mg | ORAL_TABLET | Freq: Four times a day (QID) | ORAL | Status: DC | PRN

## 2012-03-20 MED ORDER — PRAMOXINE-HC 1-2.5 % EX LOTN: 1.0000 "application " | TOPICAL_LOTION | Freq: Two times a day (BID) | CUTANEOUS | Status: DC

## 2012-03-20 NOTE — ED Notes (Signed)
Pt c/o hemmorrhoids over the past ten months off/on. Over the past month they have been very painful. Pt was last seen here in October for same problem. Pt states that she just got health insurance and needs referral to specialist that was mentioned to her at the last visit.   Pt has tried otc meds and rx with no relief in symptoms.

## 2012-03-20 NOTE — ED Provider Notes (Signed)
History     CSN: [id number]  Arrival date & time 03/20/12  1612   None     Chief Complaint  Patient presents with  . Hemorrhoids   HPI Patient presents for evaluation of sharp rectal pain with associated rectal bleeding 2 weeks ago.  Patient unsure if blood came from hemorrhoid or not. Denies straining with defecation, melena. Tried to increase fiber in diet but not on supplement. The patient denies n/v/d, abdominal pain, symptoms of dehydration.  The patient has had intermittent pain for one year at least. She was seen in October and states pain was tided over after [facility] suppositories, lidocaine jelly, and vicodin. Over last month pain has worsened and become even more sharp and painful over last week up to 9/10. No anal intercourse or injury. Over past year has used multitude of OTC remedies with some but only short lived relief. Was supposed to see GI after last visit but didn't make it as no insurance. Now has Medicaid and a primary provider. Hoping to be tided over until pain controlled.  Past Medical History  Diagnosis Date  . Asthma     Past Surgical History  Procedure Date  . Tubal ligation    family history of Crohn's disease in father   History  Substance Use Topics  . Smoking status: Current Every Day Smoker  . Smokeless tobacco: Not on file  . Alcohol Use: Yes   Review of Systems  Constitutional: Negative for activity change and unexpected weight change.  HENT: Negative for neck pain and neck stiffness.   Eyes: Negative for pain and discharge.  Respiratory: Negative for cough and shortness of breath.   Cardiovascular: Negative for chest pain and palpitations.  Gastrointestinal: Positive for blood in stool, anal bleeding and rectal pain. Negative for nausea, vomiting, abdominal pain, diarrhea, constipation and abdominal distention.  Genitourinary: Negative for flank pain.  Musculoskeletal: Negative for back pain and arthralgias.  Neurological: Negative for  dizziness and headaches.  Hematological: Negative for adenopathy. Does not bruise/bleed easily.  Psychiatric/Behavioral: Negative for confusion and agitation.    Allergies  Review of patient's allergies indicates no known allergies.  Home Medications   Current Outpatient Rx  Name  Route  Sig  Dispense  Refill  . ONE-DAILY MULTI VITAMINS PO TABS   Oral   Take 1 tablet by mouth daily.         . ALBUTEROL SULFATE HFA 108 (90 BASE) MCG/ACT IN AERS   Inhalation   Inhale 2 puffs into the lungs every 6 (six) hours as needed.         Marland Kitchen HYDROCORTISONE ACETATE 25 MG RE SUPP   Rectal   Place 1 suppository (25 mg total) rectally 2 (two) times daily. For 6 days.   12 suppository   0   . PRAMOXINE-HC 1-2.5 % EX LOTN   Topical   Apply 1 application topically 2 (two) times daily.   59 mL   1   . TRAMADOL HCL 50 MG PO TABS   Oral   Take 1 tablet (50 mg total) by mouth every 6 (six) hours as needed for pain.   30 tablet   0     BP 112/76  Pulse 98  Temp 98.2 F (36.8 C) (Oral)  Resp 19  SpO2 96%  LMP 03/02/2012  Physical Exam  Constitutional: She appears well-developed and well-nourished.  HENT:  Head: Normocephalic and atraumatic.  Eyes: EOM are normal. Pupils are equal, round, and reactive to light.  Neck: Normal range of motion. Neck supple.  Cardiovascular: Normal rate and regular rhythm.  Exam reveals no gallop and no friction rub.   No murmur heard. Pulmonary/Chest: Effort normal and breath sounds normal.  Abdominal: Soft. Bowel sounds are normal. She exhibits no distension. There is no tenderness. There is no rebound and no guarding.  Genitourinary:       Circumferential external hemorrhoids noted. Extremely tender just to having buttock cheeks divided. Very tender to palpation. Not thrombosed.     ED Course  Procedures (including critical care time)  Labs Reviewed - No data to display No results found.   1. External hemorrhoids    MDM  Rx Anusol-HC x  6 days. Topical anesthetic. Tramadol for pain. Patient needs to see GI as soon as possible given rectal bleeding (given #) and severity of pain and length of difficulties (may be able to treat hemorrhoids as well). If GI does not offer care for hemorrhoids, will need referral to general surgery.   Dr. Lorenz Coaster has seen and evaluated the patient. We have discussed the history, exam, assessment, and plan as noted above. He agrees with management.   Aldine Contes. Marti Sleigh, MD, PGY2 03/20/2012 6:09 PM           Shelva Majestic, MD 03/20/12 [phone]

## 2012-03-20 NOTE — ED Notes (Signed)
Waiting discharge papers 

## 2012-03-20 NOTE — ED Provider Notes (Signed)
Medical screening examination/treatment/procedure(s) were performed by a resident physician and as supervising physician I was immediately available for consultation/collaboration.  Additionally, I saw the patient independently, verified the history, examined the patient and discussed the treatment plan with the resident.  Leslee Home, M.D.    Reuben Likes, MD 03/20/12 2030

## 2012-03-21 MED ORDER — LIDOCAINE HCL 2 % EX GEL: CUTANEOUS | Status: DC | PRN

## 2012-03-22 ENCOUNTER — Telehealth (HOSPITAL_COMMUNITY): Payer: Self-pay | Admitting: Emergency Medicine

## 2012-03-22 NOTE — ED Notes (Signed)
Patient called requesting RX for Lidocaine for hemorrhoids.  Chart reviewed.  Patient made aware an RX was sent in yesterday to the [facility] on [address].

## 2012-04-09 ENCOUNTER — Ambulatory Visit (INDEPENDENT_AMBULATORY_CARE_PROVIDER_SITE_OTHER): Payer: Medicaid Other | Admitting: Gastroenterology

## 2012-04-09 ENCOUNTER — Encounter: Payer: Self-pay | Admitting: Gastroenterology

## 2012-04-09 VITALS — BP 80/54 | HR 60 | Ht 66.5 in | Wt 115.6 lb

## 2012-04-09 DIAGNOSIS — K649 Unspecified hemorrhoids: Secondary | ICD-10-CM

## 2012-04-09 DIAGNOSIS — Z800 Family history of malignant neoplasm of digestive organs: Secondary | ICD-10-CM

## 2012-04-09 NOTE — Progress Notes (Signed)
History of Present Illness: 47 year old Afro-American female referred at the request of Dr. Shawnie Pons evaluation of hemorrhoids. For the past year she's been suffering from symptomatic hemorrhoids characterized by pain, protrusion and occasional bleeding. She's tried various topicals and suppositories without improvement. She moves her bowels regularly. Family history is pertinent for her father who developed colon cancer in his late 70s.    Past Medical History  Diagnosis Date  . Asthma   . Anemia    Past Surgical History  Procedure Laterality Date  . Btl    . Tubal ligation     family history includes Colon cancer in her father and Hypertension in her mother. Current Outpatient Prescriptions  Medication Sig Dispense Refill  . albuterol (PROVENTIL HFA;VENTOLIN HFA) 108 (90 BASE) MCG/ACT inhaler Inhale 2 puffs into the lungs every 6 (six) hours as needed.      . hydrocortisone (ANUSOL-HC) 25 MG suppository Place 1 suppository (25 mg total) rectally 2 (two) times daily. For 6 days.  12 suppository  0  . hydrocortisone-pramoxine (PRAMOSONE) 1-2.5 % LOTN Apply 1 application topically 2 (two) times daily.  59 mL  1  . lidocaine (XYLOCAINE) 2 % jelly Apply topically as needed.  30 mL  0  . Multiple Vitamin (MULTIVITAMIN) tablet Take 1 tablet by mouth daily.      . traMADol (ULTRAM) 50 MG tablet Take 1 tablet (50 mg total) by mouth every 6 (six) hours as needed for pain.  30 tablet  0   No current facility-administered medications for this visit.   Allergies as of 04/09/2012  . (No Known Allergies)    reports that she has been smoking.  She has never used smokeless tobacco. She reports that  drinks alcohol. She reports that she does not use illicit drugs.     Review of Systems: Pertinent positive and negative review of systems were noted in the above HPI section. All other review of systems were otherwise negative.  Vital signs were reviewed in today's medical record Physical  Exam: General: Well developed , well nourished, no acute distress Skin: anicteric Head: Normocephalic and atraumatic Eyes:  sclerae anicteric, EOMI Ears: Normal auditory acuity Mouth: No deformity or lesions Neck: Supple, no masses or thyromegaly Lungs: Clear throughout to auscultation Heart: Regular rate and rhythm; no murmurs, rubs or bruits Abdomen: Soft, non tender and non distended. No masses, hepatosplenomegaly or hernias noted. Normal Bowel sounds Rectal: Grade 3 hemorrhoids are present. There are no rectal masses. Stool is Hemoccult negative. There is moderate tenderness on rectal exam Musculoskeletal: Symmetrical with no gross deformities  Skin: No lesions on visible extremities Pulses:  Normal pulses noted Extremities: No clubbing, cyanosis, edema or deformities noted Neurological: Alert oriented x 4, grossly nonfocal Cervical Nodes:  No significant cervical adenopathy Inguinal Nodes: No significant inguinal adenopathy Psychological:  Alert and cooperative. Normal mood and affect

## 2012-04-09 NOTE — Assessment & Plan Note (Signed)
Plan screening colonoscopy 

## 2012-04-09 NOTE — Assessment & Plan Note (Addendum)
Patient has grade 3 hemorrhoids with symptoms despite medical therapy. Options were discussed including band ligation and surgical hemorrhoidectomy. The patient wishes to proceed with surgical therapy. Accordingly, weill refer to Gen. surgery for evaluation and therapy.

## 2012-04-09 NOTE — Patient Instructions (Addendum)
You have been given a separate informational sheet regarding your tobacco use, the importance of quitting and local resources to help you quit.  Hemorrhoids Hemorrhoids are enlarged (dilated) veins around the rectum. There are 2 types of hemorrhoids, and the type of hemorrhoid is determined by its location. Internal hemorrhoids occur in the veins just inside the rectum.They are usually not painful, but they may bleed.However, they may poke through to the outside and become irritated and painful. External hemorrhoids involve the veins outside the anus and can be felt as a painful swelling or hard lump near the anus.They are often itchy and may crack and bleed. Sometimes clots will form in the veins. This makes them swollen and painful. These are called thrombosed hemorrhoids. CAUSES Causes of hemorrhoids include:  Pregnancy. This increases the pressure in the hemorrhoidal veins.  Constipation.  Straining to have a bowel movement.  Obesity.  Heavy lifting or other activity that caused you to strain. TREATMENT Most of the time hemorrhoids improve in 1 to 2 weeks. However, if symptoms do not seem to be getting better or if you have a lot of rectal bleeding, your caregiver may perform a procedure to help make the hemorrhoids get smaller or remove them completely.Possible treatments include:  Rubber band ligation. A rubber band is placed at the base of the hemorrhoid to cut off the circulation.  Sclerotherapy. A chemical is injected to shrink the hemorrhoid.  Infrared light therapy. Tools are used to burn the hemorrhoid.  Hemorrhoidectomy. This is surgical removal of the hemorrhoid. HOME CARE INSTRUCTIONS   Increase fiber in your diet. Ask your caregiver about using fiber supplements.  Drink enough water and fluids to keep your urine clear or pale yellow.  Exercise regularly.  Go to the bathroom when you have the urge to have a bowel movement. Do not wait.  Avoid straining to have  bowel movements.  Keep the anal area dry and clean.  Only take over-the-counter or prescription medicines for pain, discomfort, or fever as directed by your caregiver. If your hemorrhoids are thrombosed:  Take warm sitz baths for 20 to 30 minutes, 3 to 4 times per day.  If the hemorrhoids are very tender and swollen, place ice packs on the area as tolerated. Using ice packs between sitz baths may be helpful. Fill a plastic bag with ice. Place a towel between the bag of ice and your skin.  Medicated creams and suppositories may be used or applied as directed.  Do not use a donut-shaped pillow or sit on the toilet for long periods. This increases blood pooling and pain. SEEK MEDICAL CARE IF:   You have increasing pain and swelling that is not controlled with your medicine.  You have uncontrolled bleeding.  You have difficulty or you are unable to have a bowel movement.  You have pain or inflammation outside the area of the hemorrhoids.  You have chills or an oral temperature above 102 F (38.9 C). MAKE SURE YOU:   Understand these instructions.  Will watch your condition.  Will get help right away if you are not doing well or get worse. Document Released: 02/03/2000 Document Revised: 04/30/2011 Document Reviewed: 01/16/2010 [facility] Patient Information 2013 [place], [place].

## 2012-04-29 ENCOUNTER — Encounter: Payer: Self-pay | Admitting: Gastroenterology

## 2012-04-29 ENCOUNTER — Ambulatory Visit (AMBULATORY_SURGERY_CENTER): Payer: Medicaid Other | Admitting: Gastroenterology

## 2012-04-29 VITALS — BP 112/64 | HR 66 | Temp 97.5°F | Resp 20 | Ht 66.0 in | Wt 115.0 lb

## 2012-04-29 DIAGNOSIS — Z800 Family history of malignant neoplasm of digestive organs: Secondary | ICD-10-CM

## 2012-04-29 DIAGNOSIS — K649 Unspecified hemorrhoids: Secondary | ICD-10-CM

## 2012-04-29 DIAGNOSIS — Z1211 Encounter for screening for malignant neoplasm of colon: Secondary | ICD-10-CM

## 2012-04-29 MED ORDER — SODIUM CHLORIDE 0.9 % IV SOLN: 500.0000 mL | INTRAVENOUS | Status: DC

## 2012-04-29 NOTE — Op Note (Signed)
[facility] [address] [place], [address]   COLONOSCOPY PROCEDURE REPORT  PATIENT: Vicki Andrews, Vicki Andrews  MR#: [id number] BIRTHDATE: [birth year] , 46  yrs. old GENDER: Female ENDOSCOPIST: Louis Meckel, MD REFERRED WU:XLKGM Pratt, M.D. PROCEDURE DATE:  04/29/2012 PROCEDURE:   Colonoscopy, diagnostic ASA CLASS:   Class I INDICATIONS:Patient's immediate family history of colon cancer. MEDICATIONS: MAC sedation, administered by CRNA and propofol (Diprivan) 300mg  IV  DESCRIPTION OF PROCEDURE:   After the risks benefits and alternatives of the procedure were thoroughly explained, informed consent was obtained.  A digital rectal exam revealed no abnormalities of the rectum.   The LB [id number]  endoscope was introduced through the anus and advanced to the cecum, which was identified by both the appendix and ileocecal valve. No adverse events experienced.   The quality of the prep was Suprep good  The instrument was then slowly withdrawn as the colon was fully examined.      COLON FINDINGS: Internal hemorrhoids were found.  Retroflexed views revealed no abnormalities. The time to cecum=2 minutes 50 seconds. Withdrawal time=9 minutes 18 seconds.  The scope was withdrawn and the procedure completed. COMPLICATIONS: There were no complications.  ENDOSCOPIC IMPRESSION: Internal hemorrhoids  RECOMMENDATIONS: 1.  Given your significant family history of colon cancer, you should have a repeat colonoscopy in 5 years 2.  My office will arrange for you to meet with a surgeon for followup of hemorrhoids   eSigned:  Louis Meckel, MD 04/29/2012 3:16 PM   cc:

## 2012-04-29 NOTE — Patient Instructions (Addendum)
YOU HAD AN ENDOSCOPIC PROCEDURE TODAY AT THE [facility]: Refer to the procedure report that was given to you for any specific questions about what was found during the examination.  If the procedure report does not answer your questions, please call your gastroenterologist to clarify.  If you requested that your care partner not be given the details of your procedure findings, then the procedure report has been included in a sealed envelope for you to review at your convenience later.  YOU SHOULD EXPECT: Some feelings of bloating in the abdomen. Passage of more gas than usual.  Walking can help get rid of the air that was put into your GI tract during the procedure and reduce the bloating. If you had a lower endoscopy (such as a colonoscopy or flexible sigmoidoscopy) you may notice spotting of blood in your stool or on the toilet paper. If you underwent a bowel prep for your procedure, then you may not have a normal bowel movement for a few days.  DIET: Your first meal following the procedure should be a light meal and then it is ok to progress to your normal diet.  A half-sandwich or bowl of soup is an example of a good first meal.  Heavy or fried foods are harder to digest and may make you feel nauseous or bloated.  Likewise meals heavy in dairy and vegetables can cause extra gas to form and this can also increase the bloating.  Drink plenty of fluids but you should avoid alcoholic beverages for 24 hours.  ACTIVITY: Your care partner should take you home directly after the procedure.  You should plan to take it easy, moving slowly for the rest of the day.  You can resume normal activity the day after the procedure however you should NOT DRIVE or use heavy machinery for 24 hours (because of the sedation medicines used during the test).    SYMPTOMS TO REPORT IMMEDIATELY: A gastroenterologist can be reached at any hour.  During normal business hours, 8:30 AM to 5:00 PM Monday through Friday,  call [phone].  After hours and on weekends, please call the GI answering service at [phone] who will take a message and have the physician on call contact you.   Following lower endoscopy (colonoscopy or flexible sigmoidoscopy):  Excessive amounts of blood in the stool  Significant tenderness or worsening of abdominal pains  Swelling of the abdomen that is new, acute  Fever of 100F or higher  Following upper endoscopy (EGD)  Vomiting of blood or coffee ground material  New chest pain or pain under the shoulder blades  Painful or persistently difficult swallowing  New shortness of breath  Fever of 100F or higher  Black, tarry-looking stools  FOLLOW UP: If any biopsies were taken you will be contacted by phone or by letter within the next 1-3 weeks.  Call your gastroenterologist if you have not heard about the biopsies in 3 weeks.  Our staff will call the home number listed on your records the next business day following your procedure to check on you and address any questions or concerns that you may have at that time regarding the information given to you following your procedure. This is a courtesy call and so if there is no answer at the home number and we have not heard from you through the emergency physician on call, we will assume that you have returned to your regular daily activities without incident.  SIGNATURES/CONFIDENTIALITY: You and/or your care   partner have signed paperwork which will be entered into your electronic medical record.  These signatures attest to the fact that that the information above on your After Visit Summary has been reviewed and is understood.  Full responsibility of the confidentiality of this discharge information lies with you and/or your care-partner.  

## 2012-04-29 NOTE — Progress Notes (Signed)
Patient did not experience any of the following events: a burn prior to discharge; a fall within the facility; wrong site/side/patient/procedure/implant event; or a hospital transfer or hospital admission upon discharge from the facility. (G8907) Patient did not have preoperative order for IV antibiotic SSI prophylaxis. (G8918)  

## 2012-04-29 NOTE — Progress Notes (Signed)
Lidocaine-40mg IV prior to Propofol InductionPropofol given over incremental dosages 

## 2012-04-30 ENCOUNTER — Telehealth: Payer: Self-pay

## 2012-04-30 ENCOUNTER — Telehealth: Payer: Self-pay | Admitting: *Deleted

## 2012-04-30 ENCOUNTER — Other Ambulatory Visit: Payer: Self-pay | Admitting: Gastroenterology

## 2012-04-30 DIAGNOSIS — K649 Unspecified hemorrhoids: Secondary | ICD-10-CM

## 2012-04-30 NOTE — Telephone Encounter (Signed)
Spoke with pt and she is aware that the referral has to come from her PCP on her medicaid card. Pt states she will call her PCP regarding making the referral. Dr. Arlyce Dice aware.

## 2012-04-30 NOTE — Telephone Encounter (Signed)
Message copied by Chrystie Nose on Wed Apr 30, 2012 10:55 AM ------      Message from: Marnette Burgess      Created: Wed Apr 30, 2012 10:28 AM      Regarding: RE: Appt       Dear Bonita Quin,            This patient's insurance is Medicaid [place] [employer] and needs to be referred by the PCP on her Medicaid Card.  If you have any questions please call [phone].            Thank You,      Elane Fritz      ----- Message -----         From: Lily Lovings, RN         Sent: 04/30/2012  10:15 AM           To: Marnette Burgess      Subject: Appt                                                     Elane Fritz,            This pt needs an appt with one of the surgeons for possible hemorrhoid surgery.            Thanks,      Selinda Michaels RN       ------

## 2012-04-30 NOTE — Telephone Encounter (Signed)
  Follow up Call-  Call back number 04/29/2012  Post procedure Call Back phone  # [phone]  Permission to leave phone message Yes     Patient questions:  Do you have a fever, pain , or abdominal swelling? no Pain Score  0 *  Have you tolerated food without any problems? yes  Have you been able to return to your normal activities? yes  Do you have any questions about your discharge instructions: Diet   no Medications  no Follow up visit  no  Do you have questions or concerns about your Care? no  Actions: * If pain score is 4 or above: No action needed, pain <4.

## 2012-05-14 ENCOUNTER — Telehealth: Payer: Self-pay | Admitting: Gastroenterology

## 2012-05-14 MED ORDER — HYDROCORTISONE ACETATE 25 MG RE SUPP: 25.0000 mg | Freq: Two times a day (BID) | RECTAL | Status: DC

## 2012-05-14 NOTE — Telephone Encounter (Signed)
Pt called wanting to know if we had received a referral from her PCP for the surgeons office. Let pt know that the referral would go to the surgeon's office and not [place] but that I could see it in the computer. Pt given the phone number for [facility] to check on the referral. Pt also requests a refill on her anusol supp. Refill sent to the pharmacy.

## 2012-05-26 ENCOUNTER — Encounter (INDEPENDENT_AMBULATORY_CARE_PROVIDER_SITE_OTHER): Payer: Self-pay | Admitting: Surgery

## 2012-05-26 ENCOUNTER — Ambulatory Visit (INDEPENDENT_AMBULATORY_CARE_PROVIDER_SITE_OTHER): Payer: Medicaid Other | Admitting: Surgery

## 2012-05-26 VITALS — BP 112/72 | HR 68 | Resp 16 | Ht 67.0 in | Wt 116.8 lb

## 2012-05-26 DIAGNOSIS — K649 Unspecified hemorrhoids: Secondary | ICD-10-CM

## 2012-05-26 MED ORDER — HYDROCORTISONE ACE-PRAMOXINE 1-1 % RE FOAM: 1.0000 | Freq: Two times a day (BID) | RECTAL | Status: DC

## 2012-05-26 NOTE — Progress Notes (Signed)
Patient ID: TAMANNA WHITSON, female   DOB: Sep 17, 1965, 47 y.o.   MRN: [id number]  No chief complaint on file.   HPI MARC SIVERTSEN is a 47 y.o. female.  Patient sent at the request of Dr. Arlyce Dice for hemorrhoid disease. Chief complaint rectal itching. Denies any significant bleeding or significant pain. Condition better today than in the past. Family history of colon cancer Crohn disease. She's had rectal issues for about a year but getting worse over the last 2 months. HPI  Past Medical History  Diagnosis Date  . Asthma   . Anemia     Past Surgical History  Procedure Laterality Date  . Btl    . Tubal ligation      Family History  Problem Relation Age of Onset  . Colon cancer Father   . Crohn's disease Father   . Hypertension Mother     Social History History  Substance Use Topics  . Smoking status: Current Every Day Smoker -- 0.50 packs/day  . Smokeless tobacco: Never Used  . Alcohol Use: Yes     Comment: socially    No Known Allergies  Current Outpatient Prescriptions  Medication Sig Dispense Refill  . hydrocortisone (ANUSOL-HC) 25 MG suppository Place 1 suppository (25 mg total) rectally 2 (two) times daily. For 6 days.  12 suppository  0  . lidocaine (XYLOCAINE) 2 % jelly Apply topically as needed.  30 mL  0  . Multiple Vitamin (MULTIVITAMIN) tablet Take 1 tablet by mouth daily.      . traMADol (ULTRAM) 50 MG tablet Take 50 mg by mouth every 6 (six) hours as needed for pain.      . hydrocortisone-pramoxine (PROCTOFOAM HC) rectal foam Place 1 applicator rectally 2 (two) times daily.  10 g  0   No current facility-administered medications for this visit.    Review of Systems Review of Systems  Constitutional: Negative.   HENT: Negative.   Eyes: Negative.   Respiratory: Negative.   Cardiovascular: Negative.   Gastrointestinal: Positive for rectal pain.  Endocrine: Negative.   Genitourinary: Negative.   Musculoskeletal: Negative.   Skin: Negative.     Allergic/Immunologic: Negative.   Neurological: Negative.   Hematological: Negative.   Psychiatric/Behavioral: Negative.     Blood pressure 112/72, pulse 68, resp. rate 16, height 5\' 7"  (1.702 m), weight 116 lb 12.8 oz (52.98 kg), last menstrual period 04/26/2012.  Physical Exam Physical Exam  Constitutional: She appears well-developed and well-nourished.  HENT:  Head: Normocephalic and atraumatic.  Eyes: EOM are normal. Pupils are equal, round, and reactive to light.  Neck: Normal range of motion. Neck supple.  Genitourinary:     Skin: Skin is warm and dry.  Psychiatric: She has a normal mood and affect. Her behavior is normal. Judgment and thought content normal.    Data Reviewed Dr  Nita Sells notes  Assessment    Grade 1 internal hemorrhoids 3 columns    Plan    Recommend medical management for now. If they flare up, banding or sclerotherapy can be done. Information given on hemorrhoids high fiber diet. Return to clinic as needed.       Vibha Ferdig A. 05/26/2012, 12:28 PM

## 2012-05-26 NOTE — Patient Instructions (Signed)
Hemorrhoids Hemorrhoids are enlarged (dilated) veins around the rectum. There are 2 types of hemorrhoids, and the type of hemorrhoid is determined by its location. Internal hemorrhoids occur in the veins just inside the rectum.They are usually not painful, but they may bleed.However, they may poke through to the outside and become irritated and painful. External hemorrhoids involve the veins outside the anus and can be felt as a painful swelling or hard lump near the anus.They are often itchy and may crack and bleed. Sometimes clots will form in the veins. This makes them swollen and painful. These are called thrombosed hemorrhoids. CAUSES Causes of hemorrhoids include:  Pregnancy. This increases the pressure in the hemorrhoidal veins.  Constipation.  Straining to have a bowel movement.  Obesity.  Heavy lifting or other activity that caused you to strain. TREATMENT Most of the time hemorrhoids improve in 1 to 2 weeks. However, if symptoms do not seem to be getting better or if you have a lot of rectal bleeding, your caregiver may perform a procedure to help make the hemorrhoids get smaller or remove them completely.Possible treatments include:  Rubber band ligation. A rubber band is placed at the base of the hemorrhoid to cut off the circulation.  Sclerotherapy. A chemical is injected to shrink the hemorrhoid.  Infrared light therapy. Tools are used to burn the hemorrhoid.  Hemorrhoidectomy. This is surgical removal of the hemorrhoid. HOME CARE INSTRUCTIONS   Increase fiber in your diet. Ask your caregiver about using fiber supplements.  Drink enough water and fluids to keep your urine clear or pale yellow.  Exercise regularly.  Go to the bathroom when you have the urge to have a bowel movement. Do not wait.  Avoid straining to have bowel movements.  Keep the anal area dry and clean.  Only take over-the-counter or prescription medicines for pain, discomfort, or fever as  directed by your caregiver. If your hemorrhoids are thrombosed:  Take warm sitz baths for 20 to 30 minutes, 3 to 4 times per day.  If the hemorrhoids are very tender and swollen, place ice packs on the area as tolerated. Using ice packs between sitz baths may be helpful. Fill a plastic bag with ice. Place a towel between the bag of ice and your skin.  Medicated creams and suppositories may be used or applied as directed.  Do not use a donut-shaped pillow or sit on the toilet for long periods. This increases blood pooling and pain. SEEK MEDICAL CARE IF:   You have increasing pain and swelling that is not controlled with your medicine.  You have uncontrolled bleeding.  You have difficulty or you are unable to have a bowel movement.  You have pain or inflammation outside the area of the hemorrhoids.  You have chills or an oral temperature above 102 F (38.9 C). MAKE SURE YOU:        High-Fiber Diet Fiber is found in fruits, vegetables, and grains. A high-fiber diet encourages the addition of more whole grains, legumes, fruits, and vegetables in your diet. The recommended amount of fiber for adult males is 38 g per day. For adult females, it is 25 g per day. Pregnant and lactating women should get 28 g of fiber per day. If you have a digestive or bowel problem, ask your caregiver for advice before adding high-fiber foods to your diet. Eat a variety of high-fiber foods instead of only a select few type of foods.  PURPOSE  To increase stool bulk.  To make  bowel movements more regular to prevent constipation.  To lower cholesterol.  To prevent overeating. WHEN IS THIS DIET USED?  It may be used if you have constipation and hemorrhoids.  It may be used if you have uncomplicated diverticulosis (intestine condition) and irritable bowel syndrome.  It may be used if you need help with weight management.  It may be used if you want to add it to your diet as a protective measure  against atherosclerosis, diabetes, and cancer. SOURCES OF FIBER  Whole-grain breads and cereals.  Fruits, such as apples, oranges, bananas, berries, prunes, and pears.  Vegetables, such as green peas, carrots, sweet potatoes, beets, broccoli, cabbage, spinach, and artichokes.  Legumes, such split peas, soy, lentils.  Almonds. FIBER CONTENT IN FOODS Starches and Grains / Dietary Fiber (g)  Cheerios, 1 cup / 3 g  Corn Flakes cereal, 1 cup / 0.7 g  Rice crispy treat cereal, 1 cup / 0.3 g  Instant oatmeal (cooked),  cup / 2 g  Frosted wheat cereal, 1 cup / 5.1 g  Brown, long-grain rice (cooked), 1 cup / 3.5 g  White, long-grain rice (cooked), 1 cup / 0.6 g  Enriched macaroni (cooked), 1 cup / 2.5 g Legumes / Dietary Fiber (g)  Baked beans (canned, plain, or vegetarian),  cup / 5.2 g  Kidney beans (canned),  cup / 6.8 g  Pinto beans (cooked),  cup / 5.5 g Breads and Crackers / Dietary Fiber (g)  Plain or honey graham crackers, 2 squares / 0.7 g  Saltine crackers, 3 squares / 0.3 g  Plain, salted pretzels, 10 pieces / 1.8 g  Whole-wheat bread, 1 slice / 1.9 g  White bread, 1 slice / 0.7 g  Raisin bread, 1 slice / 1.2 g  Plain bagel, 3 oz / 2 g  Flour tortilla, 1 oz / 0.9 g  Corn tortilla, 1 small / 1.5 g  Hamburger or hotdog bun, 1 small / 0.9 g Fruits / Dietary Fiber (g)  Apple with skin, 1 medium / 4.4 g  Sweetened applesauce,  cup / 1.5 g  Banana,  medium / 1.5 g  Grapes, 10 grapes / 0.4 g  Orange, 1 small / 2.3 g  Raisin, 1.5 oz / 1.6 g  Melon, 1 cup / 1.4 g Vegetables / Dietary Fiber (g)  Green beans (canned),  cup / 1.3 g  Carrots (cooked),  cup / 2.3 g  Broccoli (cooked),  cup / 2.8 g  Peas (cooked),  cup / 4.4 g  Mashed potatoes,  cup / 1.6 g  Lettuce, 1 cup / 0.5 g  Corn (canned),  cup / 1.6 g  Tomato,  cup / 1.1 g Document Released: 02/05/2005 Document Revised: 08/07/2011 Document Reviewed:  05/10/2011 [facility] Patient Information 2013 [place], [place].   Understand these instructions.  Will watch your condition.  Will get help right away if you are not doing well or get worse. Document Released: 02/03/2000 Document Revised: 04/30/2011 Document Reviewed: 01/16/2010 [facility] Patient Information 2013 [employer], [place].   anusol cream or prep H creams not suppository.  Can use hydrocortisone cream.  Take fiber supplement and Miralax daily.

## 2012-08-24 ENCOUNTER — Other Ambulatory Visit (HOSPITAL_COMMUNITY)
Admission: RE | Admit: 2012-08-24 | Discharge: 2012-08-24 | Disposition: A | Discharge status: Home / Self Care | Payer: Medicaid Other | Source: Ambulatory Visit | Attending: Family Medicine | Admitting: Family Medicine

## 2012-08-24 ENCOUNTER — Emergency Department (HOSPITAL_COMMUNITY)
Admission: EM | Admit: 2012-08-24 | Discharge: 2012-08-24 | Disposition: A | Discharge status: Home / Self Care | Payer: Medicaid Other | Source: Home / Self Care

## 2012-08-24 ENCOUNTER — Encounter (HOSPITAL_COMMUNITY): Payer: Self-pay | Admitting: Emergency Medicine

## 2012-08-24 DIAGNOSIS — Z113 Encounter for screening for infections with a predominantly sexual mode of transmission: Secondary | ICD-10-CM | POA: Insufficient documentation

## 2012-08-24 DIAGNOSIS — N760 Acute vaginitis: Secondary | ICD-10-CM | POA: Insufficient documentation

## 2012-08-24 DIAGNOSIS — N739 Female pelvic inflammatory disease, unspecified: Secondary | ICD-10-CM

## 2012-08-24 LAB — POCT I-STAT, CHEM 8
Calcium, Ion: 1.22 mmol/L (ref 1.12–1.23)
Chloride: 107 mEq/L (ref 96–112)
Glucose, Bld: 85 mg/dL (ref 70–99)
HCT: 45 % (ref 36.0–46.0)
Hemoglobin: 15.3 g/dL — ABNORMAL HIGH (ref 12.0–15.0)
Potassium: 4 mEq/L (ref 3.5–5.1)

## 2012-08-24 LAB — CBC WITH DIFFERENTIAL/PLATELET
Basophils Absolute: 0 10*3/uL (ref 0.0–0.1)
Basophils Relative: 0 % (ref 0–1)
Eosinophils Absolute: 0.1 10*3/uL (ref 0.0–0.7)
Hemoglobin: 14.5 g/dL (ref 12.0–15.0)
MCH: 31.5 pg (ref 26.0–34.0)
MCHC: 33.8 g/dL (ref 30.0–36.0)
Monocytes Absolute: 0.4 10*3/uL (ref 0.1–1.0)
Monocytes Relative: 10 % (ref 3–12)
Neutro Abs: 1.8 10*3/uL (ref 1.7–7.7)
Neutrophils Relative %: 48 % (ref 43–77)
RDW: 12.5 % (ref 11.5–15.5)

## 2012-08-24 LAB — POCT PREGNANCY, URINE: Preg Test, Ur: NEGATIVE

## 2012-08-24 MED ORDER — ONDANSETRON 4 MG PO TBDP
ORAL_TABLET | ORAL | Status: AC
  Filled 2012-08-24: qty 2

## 2012-08-24 MED ORDER — DOXYCYCLINE HYCLATE 100 MG PO CAPS: 100.0000 mg | ORAL_CAPSULE | Freq: Two times a day (BID) | ORAL | Status: DC

## 2012-08-24 MED ORDER — ONDANSETRON 4 MG PO TBDP
8.0000 mg | ORAL_TABLET | Freq: Once | ORAL | Status: AC
  Administered 2012-08-24: 8 mg via ORAL

## 2012-08-24 MED ORDER — CEFTRIAXONE SODIUM 1 G IJ SOLR
INTRAMUSCULAR | Status: AC
  Filled 2012-08-24: qty 10

## 2012-08-24 MED ORDER — CEFTRIAXONE SODIUM 1 G IJ SOLR
1.0000 g | Freq: Once | INTRAMUSCULAR | Status: AC
  Administered 2012-08-24: 1 g via INTRAMUSCULAR

## 2012-08-24 MED ORDER — AZITHROMYCIN 250 MG PO TABS
ORAL_TABLET | ORAL | Status: AC
  Filled 2012-08-24: qty 4

## 2012-08-24 MED ORDER — LIDOCAINE HCL (PF) 1 % IJ SOLN
INTRAMUSCULAR | Status: AC
  Filled 2012-08-24: qty 5

## 2012-08-24 MED ORDER — FLUCONAZOLE 150 MG PO TABS: 150.0000 mg | ORAL_TABLET | Freq: Once | ORAL | Status: AC

## 2012-08-24 MED ORDER — AZITHROMYCIN 250 MG PO TABS
1000.0000 mg | ORAL_TABLET | Freq: Once | ORAL | Status: AC
  Administered 2012-08-24: 1000 mg via ORAL

## 2012-08-24 NOTE — ED Notes (Signed)
Waiting discharge papers 

## 2012-08-24 NOTE — ED Notes (Signed)
Chart review.

## 2012-08-24 NOTE — Discharge Instructions (Signed)
Pelvic Inflammatory Disease °Pelvic inflammatory disease (PID) refers to an infection in some or all of the female organs. The infection can be in the uterus, ovaries, fallopian tubes, or the surrounding tissues in the pelvis. PID can cause abdominal or pelvic pain that comes on suddenly (acute pelvic pain). PID is a serious infection because it can lead to lasting (chronic) pelvic pain or the inability to have children (infertile).  °CAUSES  °The infection is often caused by the normal bacteria found in the vaginal tissues. PID may also be caused by an infection that is spread during sexual contact. PID can also occur following:  °· The birth of a baby.   °· A miscarriage.   °· An abortion.   °· Major pelvic surgery.   °· The use of an intrauterine device (IUD).   °· A sexual assault.   °RISK FACTORS °Certain factors can put a person at higher risk for PID, such as: °· Being younger than 25 years. °· Being sexually active at a young age. °· Using nonbarrier contraception. °· Having multiple sexual partners. °· Having sex with someone who has symptoms of a genital infection. °· Using oral contraception. °Other times, certain behaviors can increase the possibility of getting PID, such as: °· Having sex during your period. °· Using a vaginal douche. °· Having an intrauterine device (IUD) in place. °SYMPTOMS  °· Abdominal or pelvic pain.   °· Fever.   °· Chills.   °· Abnormal vaginal discharge. °· Abnormal uterine bleeding.   °· Unusual pain shortly after finishing your period. °DIAGNOSIS  °Your caregiver will choose some of the following methods to make a diagnosis, such as:  °· Performing a physical exam and history. A pelvic exam typically reveals a very tender uterus and surrounding pelvis.   °· Ordering laboratory tests including a pregnancy test, blood tests, and urine test.  °· Ordering cultures of the vagina and cervix to check for a sexually transmitted infection (STI). °· Performing an ultrasound.    °· Performing a laparoscopic procedure to look inside the pelvis.   °TREATMENT  °· Antibiotic medicines may be prescribed and taken by mouth.   °· Sexual partners may be treated when the infection is caused by a sexually transmitted disease (STD).   °· Hospitalization may be needed to give antibiotics intravenously. °· Surgery may be needed, but this is rare. °It may take weeks until you are completely well. If you are diagnosed with PID, you should also be checked for human immunodeficiency virus (HIV).   °HOME CARE INSTRUCTIONS  °· If given, take your antibiotics as directed. Finish the medicine even if you start to feel better.   °· Only take over-the-counter or prescription medicines for pain, discomfort, or fever as directed by your caregiver.   °· Do not have sexual intercourse until treatment is completed or as directed by your caregiver. If PID is confirmed, your recent sexual partner(s) will need treatment.   °· Keep your follow-up appointments. °SEEK MEDICAL CARE IF:  °· You have increased or abnormal vaginal discharge.   °· You need prescription medicine for your pain.   °· You vomit.   °· You cannot take your medicines.   °· Your partner has an STD.   °SEEK IMMEDIATE MEDICAL CARE IF:  °· You have a fever.   °· You have increased abdominal or pelvic pain.   °· You have chills.   °· You have pain when you urinate.   °· You are not better after 72 hours following treatment.   °MAKE SURE YOU:  °· Understand these instructions. °· Will watch your condition. °· Will get help right away if you are not doing well or get worse. °  pelvic pain.    You have chills.    You have pain when you urinate.    You are not better after 72 hours following treatment.   MAKE SURE YOU:    Understand these instructions.   Will watch your condition.   Will get help right away if you are not doing well or get worse.  Document Released: 02/05/2005 Document Revised: 10/31/2011 Document Reviewed: 02/01/2011  [employer] Patient Information 2014 [employer].

## 2012-08-24 NOTE — ED Provider Notes (Signed)
History    CSN: [id number] Arrival date & time 08/24/12  1100  First MD Initiated Contact with Patient 08/24/12 1140     Chief Complaint  Patient presents with  . Vaginal Itching    vaginal discharge with odor x 1 wk 1/2. irritation.   (Consider location/radiation/quality/duration/timing/severity/associated sxs/prior Treatment) HPI  47 yo bf presents today with the above complaint.  Vaginal itching, abd pain, and milky vaginal discharge ongoing x 1-2 weeks.  Has vaginal odor.  Denies fever, chills, nausea, dysuria, hematuria.  States that she has had intermittent chest pain on left side when she gets stressed.  Does not have a primary care, or obgyn.   Past Medical History  Diagnosis Date  . Asthma   . Anemia    Past Surgical History  Procedure Laterality Date  . Btl    . Tubal ligation     Family History  Problem Relation Age of Onset  . Colon cancer Father   . Crohn's disease Father   . Hypertension Mother    History  Substance Use Topics  . Smoking status: Current Every Day Smoker -- 0.50 packs/day  . Smokeless tobacco: Never Used  . Alcohol Use: Yes     Comment: socially   OB History   Grav Para Term Preterm Abortions TAB SAB Ect Mult Living                 Review of Systems  Constitutional: Negative.   HENT: Negative.   Eyes: Negative.   Gastrointestinal: Positive for abdominal pain and anal bleeding. Negative for vomiting, diarrhea, constipation, blood in stool and rectal pain.  Endocrine: Negative.   Genitourinary: Positive for vaginal discharge, genital sores and pelvic pain. Negative for dysuria, hematuria and flank pain.  Musculoskeletal: Negative.   Neurological: Negative.   Hematological: Negative.   Psychiatric/Behavioral: Negative.     Allergies  Review of patient's allergies indicates no known allergies.  Home Medications   Current Outpatient Rx  Name  Route  Sig  Dispense  Refill  . Multiple Vitamin (MULTIVITAMIN) tablet   Oral   Take 1  tablet by mouth daily.         Marland Kitchen doxycycline (VIBRAMYCIN) 100 MG capsule   Oral   Take 1 capsule (100 mg total) by mouth every 12 (twelve) hours.   30 capsule   0   . fluconazole (DIFLUCAN) 150 MG tablet   Oral   Take 1 tablet (150 mg total) by mouth once.   1 tablet   1   . hydrocortisone (ANUSOL-HC) 25 MG suppository   Rectal   Place 1 suppository (25 mg total) rectally 2 (two) times daily. For 6 days.   12 suppository   0   . hydrocortisone-pramoxine (PROCTOFOAM HC) rectal foam   Rectal   Place 1 applicator rectally 2 (two) times daily.   10 g   0   . lidocaine (XYLOCAINE) 2 % jelly   Topical   Apply topically as needed.   30 mL   0   . traMADol (ULTRAM) 50 MG tablet   Oral   Take 50 mg by mouth every 6 (six) hours as needed for pain.          BP 103/79  Pulse 78  Temp(Src) 98.2 F (36.8 C) (Oral)  Resp 19  SpO2 100%  LMP 08/03/2012 Physical Exam  Constitutional: She is oriented to person, place, and time. She appears well-developed and well-nourished.  HENT:  Head: Normocephalic and atraumatic.  Eyes: EOM are normal. Pupils are equal, round, and reactive to light.  Neck: Normal range of motion.  Cardiovascular: Normal rate and regular rhythm.   Pulmonary/Chest: Effort normal and breath sounds normal.  Abdominal: She exhibits no mass. There is tenderness. There is no guarding.  Genitourinary: Vaginal discharge found.  Musculoskeletal: Normal range of motion.  Neurological: She is alert and oriented to person, place, and time.  Skin: Skin is warm and dry.  Psychiatric: She has a normal mood and affect.    ED Course  Pelvic exam Date/Time: 08/24/2012 4:38 PM Performed by: Zonia Kief Authorized by: Zonia Kief Consent: Verbal consent obtained. Consent given by: patient Patient understanding: patient states understanding of the procedure being performed Patient consent: the patient's understanding of the procedure matches consent  given Procedure consent: procedure consent matches procedure scheduled Relevant documents: relevant documents present and verified Test results: test results available and properly labeled Imaging studies: imaging studies not available Required items: required blood products, implants, devices, and special equipment available Patient identity confirmed: verbally with patient Preparation: Patient was prepped and draped in the usual sterile fashion. Local anesthesia used: no Patient sedated: no Patient tolerance: Patient tolerated the procedure well with no immediate complications. Comments: White vaginal discharge.  Cultures sent.     (including critical care time) Labs Reviewed  CBC WITH DIFFERENTIAL - Abnormal; Notable for the following:    WBC 3.8 (*)    All other components within normal limits  POCT I-STAT, CHEM 8 - Abnormal; Notable for the following:    Hemoglobin 15.3 (*)    All other components within normal limits  POCT PREGNANCY, URINE  CERVICOVAGINAL ANCILLARY ONLY   No results found. 1. Pelvic inflammatory disease   2.  Abnormal EKG with right bundle branch block.    MDM  Patient will f/u with a obgyn this week.  Needs to call and make an appt.  Also needs to get establised with a PCP to see if further workup is needed for EKG findings today.  Voices understanding.    Meds ordered this encounter  Medications  . azithromycin (ZITHROMAX) tablet 1,000 mg    Sig:   . cefTRIAXone (ROCEPHIN) injection 1 g    Sig:   . doxycycline (VIBRAMYCIN) 100 MG capsule    Sig: Take 1 capsule (100 mg total) by mouth every 12 (twelve) hours.    Dispense:  30 capsule    Refill:  0  . fluconazole (DIFLUCAN) 150 MG tablet    Sig: Take 1 tablet (150 mg total) by mouth once.    Dispense:  1 tablet    Refill:  1  . ondansetron (ZOFRAN-ODT) disintegrating tablet 8 mg    Sig:       Zonia Kief, PA-C 08/24/12 1642

## 2012-08-24 NOTE — ED Notes (Signed)
Pt c/o vaginal irritation with odor. Pt states that she has tried otc meds with no relief in symptoms.  Pt is having pelvic and abdominal pain. Pt voices concerns of std's.  Denies fever and urinary symptoms.

## 2012-08-26 ENCOUNTER — Telehealth: Payer: Self-pay | Admitting: General Practice

## 2012-08-26 DIAGNOSIS — B9689 Other specified bacterial agents as the cause of diseases classified elsewhere: Secondary | ICD-10-CM

## 2012-08-26 DIAGNOSIS — N760 Acute vaginitis: Secondary | ICD-10-CM

## 2012-08-26 MED ORDER — METRONIDAZOLE 500 MG PO TABS: 500.0000 mg | ORAL_TABLET | Freq: Two times a day (BID) | ORAL | Status: DC

## 2012-08-26 NOTE — ED Provider Notes (Signed)
Medical screening examination/treatment/procedure(s) were performed by resident physician or non-physician practitioner and as supervising physician I was immediately available for consultation/collaboration.   Arisbel Maione DOUGLAS MD.   Elda Dunkerson D Crystalann Korf, MD 08/26/12 1125 

## 2012-08-26 NOTE — Telephone Encounter (Signed)
Message copied by Kathee Delton on Tue Aug 26, 2012  1:13 PM ------      Message from: Reva Bores      Created: Mon Aug 25, 2012  6:12 PM       Someone sent to me--looks like she was not treated for BV--if she is seeing [place] and has continued sx's, course of 7 days of Flagyl is indicated. ------

## 2012-08-26 NOTE — Telephone Encounter (Signed)
Called patient and informed patient of results and asked patient if she was still having d/c issues. Patient stated yes and that she was still very uncomfortable. Instructed patient to discontinue doxycycline and to start taking the medicine I am sending to her [facility] which is Flagyl, twice a day for a week and that should help. Patient verbalized understanding. I asked patient if she had an ob/gyn and she stated no. Told patient if it was fine I would give her information to our front office staff and they would contact her with an appt for sometime in August to establish care with [place] here and that in the future if she has any female problems/issues like this one she can just call us instead of going to the ER. Patient was pleased, verbalized understanding and had no further questions

## 2012-09-01 ENCOUNTER — Telehealth (HOSPITAL_COMMUNITY): Payer: Self-pay | Admitting: *Deleted

## 2012-09-01 NOTE — ED Notes (Signed)
GC/Chlamydia neg., Affirm: Candida and Trich neg., Gardnerella pos.  I never got the Affirm result in my in basket. It was sent to Jonette Eva MD, by mistake.  She reviewed it and e-prescribed Flagyl to the [facility] on [address].  I called pt. and she confirmed she got the medication and symptoms are resolved.  I gave her the lab results. Vassie Moselle 09/01/2012

## 2012-09-02 ENCOUNTER — Encounter: Payer: Self-pay | Admitting: Obstetrics & Gynecology

## 2012-09-23 ENCOUNTER — Ambulatory Visit: Payer: Medicaid Other | Attending: Family Medicine | Admitting: Family Medicine

## 2012-09-23 ENCOUNTER — Encounter: Payer: Self-pay | Admitting: Family Medicine

## 2012-09-23 VITALS — BP 103/70 | HR 81 | Temp 98.1°F | Ht 67.0 in | Wt 110.6 lb

## 2012-09-23 DIAGNOSIS — N898 Other specified noninflammatory disorders of vagina: Secondary | ICD-10-CM

## 2012-09-23 DIAGNOSIS — A499 Bacterial infection, unspecified: Secondary | ICD-10-CM | POA: Insufficient documentation

## 2012-09-23 DIAGNOSIS — B372 Candidiasis of skin and nail: Secondary | ICD-10-CM

## 2012-09-23 DIAGNOSIS — N760 Acute vaginitis: Secondary | ICD-10-CM | POA: Insufficient documentation

## 2012-09-23 DIAGNOSIS — K649 Unspecified hemorrhoids: Secondary | ICD-10-CM

## 2012-09-23 DIAGNOSIS — F17200 Nicotine dependence, unspecified, uncomplicated: Secondary | ICD-10-CM

## 2012-09-23 DIAGNOSIS — B9689 Other specified bacterial agents as the cause of diseases classified elsewhere: Secondary | ICD-10-CM | POA: Insufficient documentation

## 2012-09-23 DIAGNOSIS — L293 Anogenital pruritus, unspecified: Secondary | ICD-10-CM

## 2012-09-23 DIAGNOSIS — I4510 Unspecified right bundle-branch block: Secondary | ICD-10-CM

## 2012-09-23 MED ORDER — HYDROXYZINE HCL 25 MG PO TABS: 25.0000 mg | ORAL_TABLET | Freq: Three times a day (TID) | ORAL | Status: DC | PRN

## 2012-09-23 MED ORDER — FLUCONAZOLE 150 MG PO TABS: 150.0000 mg | ORAL_TABLET | ORAL | Status: DC

## 2012-09-23 NOTE — Addendum Note (Signed)
Addended by: Cleora Fleet on: 09/23/2012 01:14 PM   Modules accepted: Orders

## 2012-09-23 NOTE — Progress Notes (Signed)
Patient ID: Vicki Andrews, female   DOB: 04-30-1965, 47 y.o.   MRN: [id number]  CC:  Establish Care   HPI: Pt has been on a host of antibiotics recently treating a bacterial vaginosis infection.  She was also wanting to followup with primary care because she's continuing to have severe vaginal itching.  She also reports that she was told she had an abnormal EKG when she was seen in the emergency department recently.  She was told to followup with primary care to be referred to a cardiologist for further evaluation.  She's not having any symptoms of chest pain or shortness of breath at this time.  On patient denies having any arm symptoms of heart palpitations.  She is concerned about itching.  She reports that the pruritus has been severe around her vagina area.  Patient denies fever and chills.  No Known Allergies Past Medical History  Diagnosis Date  . Asthma   . Anemia    Current Outpatient Prescriptions on File Prior to Visit  Medication Sig Dispense Refill  . Multiple Vitamin (MULTIVITAMIN) tablet Take 1 tablet by mouth daily.      . hydrocortisone (ANUSOL-HC) 25 MG suppository Place 1 suppository (25 mg total) rectally 2 (two) times daily. For 6 days.  12 suppository  0  . hydrocortisone-pramoxine (PROCTOFOAM HC) rectal foam Place 1 applicator rectally 2 (two) times daily.  10 g  0  . lidocaine (XYLOCAINE) 2 % jelly Apply topically as needed.  30 mL  0  . metroNIDAZOLE (FLAGYL) 500 MG tablet Take 1 tablet (500 mg total) by mouth 2 (two) times daily.  14 tablet  0  . traMADol (ULTRAM) 50 MG tablet Take 50 mg by mouth every 6 (six) hours as needed for pain.       No current facility-administered medications on file prior to visit.   Family History  Problem Relation Age of Onset  . Colon cancer Father   . Crohn's disease Father   . Diabetes Father   . Hypertension Mother    History   Social History  . Marital Status: Single    Spouse Name: N/A    Number of Children: 5  . Years of  Education: N/A   Occupational History  . Laborer    Social History Main Topics  . Smoking status: Current Every Day Smoker -- 0.50 packs/day  . Smokeless tobacco: Never Used  . Alcohol Use: Yes     Comment: socially  . Drug Use: No  . Sexually Active: No   Other Topics Concern  . Not on file   Social History Narrative  . No narrative on file    Review of Systems  Constitutional: Negative for fever, chills, diaphoresis, activity change, appetite change and fatigue.  HENT: Negative for ear pain, nosebleeds, congestion, facial swelling, rhinorrhea, neck pain, neck stiffness and ear discharge.   Eyes: Negative for pain, discharge, redness, itching and visual disturbance.  Respiratory: Negative for cough, choking, chest tightness, shortness of breath, wheezing and stridor.   Cardiovascular: Negative for chest pain, palpitations and leg swelling.  Gastrointestinal: Negative for abdominal distention.  Genitourinary: Negative for dysuria, urgency, frequency, hematuria, flank pain, decreased urine volume, difficulty urinating and dyspareunia.  Musculoskeletal: Negative for back pain, joint swelling, arthralgias and gait problem.  Neurological: Negative for dizziness, tremors, seizures, syncope, facial asymmetry, speech difficulty, weakness, light-headedness, numbness and headaches.  Hematological: Negative for adenopathy. Does not bruise/bleed easily.  Psychiatric/Behavioral: Negative for hallucinations, behavioral problems, confusion, dysphoric mood, decreased  concentration and agitation.    Objective:   Filed Vitals:   09/23/12 1220  BP: 103/70  Pulse: 81  Temp: 98.1 F (36.7 C)    Physical Exam  Constitutional: Appears well-developed and well-nourished. No distress.  HENT: Normocephalic. External right and left ear normal. Oropharynx is clear and moist.  Eyes: Conjunctivae and EOM are normal. PERRLA, no scleral icterus.  Neck: Normal ROM. Neck supple. No JVD. No tracheal  deviation. No thyromegaly.  CVS: RRR, S1/S2 +, no murmurs, no gallops, no carotid bruit.  Pulmonary: Effort and breath sounds normal, no stridor, rhonchi, wheezes, rales.  Abdominal: Soft. BS +,  no distension, tenderness, rebound or guarding.  Musculoskeletal: Normal range of motion. No edema and no tenderness.  Lymphadenopathy: No lymphadenopathy noted, cervical, inguinal. Neuro: Alert. Normal reflexes, muscle tone coordination. No cranial nerve deficit. Skin: Skin is warm and dry. pruritic yeast-appearing intertriginous rash in the groin noted.   Psychiatric: Normal mood and affect. Behavior, judgment, thought content normal.   Lab Results  Component Value Date   WBC 3.8* 08/24/2012   HGB 15.3* 08/24/2012   HCT 45.0 08/24/2012   MCV 93.3 08/24/2012   PLT 183 08/24/2012   Lab Results  Component Value Date   CREATININE 0.90 08/24/2012   BUN 12 08/24/2012   NA 142 08/24/2012   K 4.0 08/24/2012   CL 107 08/24/2012    No results found for this basename: HGBA1C   Lipid Panel  No results found for this basename: chol, trig, hdl, cholhdl, vldl, ldlcalc       Assessment and plan:   Patient Active Problem List   Diagnosis Date Noted  . Hemorrhoids 05/26/2012  . Family history of malignant neoplasm of gastrointestinal tract 04/09/2012  . HEMORRHOIDS 04/27/2010  . GASTRITIS 04/27/2010  . CORNS AND CALLUSES 04/27/2010  . 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 TOBACCO ABUSE 10/31/2006   We'll prescribe fluconazole 150 mg take 1 by mouth once per week for 4 weeks.  Also prescribed hydroxyzine 25 mg by mouth Q8 hours when necessary severe pruritus to use as needed.  Patient is scheduled to see a gynecologist tomorrow.  Repeat the electrocardiogram in the office today revealing persistent right bundle branch block.  Patient is being referred to cardiology for further evaluation.  Return to clinic in 3 months.   The patient was given clear instructions to go to ER or return to medical center if  symptoms don't improve, worsen or new problems develop.  The patient verbalized understanding.  The patient was told to call to get any lab results if not heard anything in the next week.    Rodney Langton, MD, CDE, FAAFP [facility] [place], [place]

## 2012-09-23 NOTE — Patient Instructions (Addendum)
Pruritus  Pruritis is an itch. There are many different problems that can cause an itch. Dry skin is one of the most common causes of itching. Most cases of itching do not require medical attention.  HOME CARE INSTRUCTIONS  Make sure your skin is moistened on a regular basis. A moisturizer that contains petroleum jelly is best for keeping moisture in your skin. If you develop a rash, you may try the following for relief:   Use corticosteroid cream.  Apply cool compresses to the affected areas.  Bathe with Epsom salts or baking soda in the bathwater.  Soak in colloidal oatmeal baths. These are available at your pharmacy.  Apply baking soda paste to the rash. Stir water into baking soda until it reaches a paste-like consistency.  Use an anti-itch lotion.  Take over-the-counter diphenhydramine medicine by mouth as the instructions direct.  Avoid scratching. Scratching may cause the rash to become infected. If itching is very bad, your caregiver may suggest prescription lotions or creams to lessen your symptoms.  Avoid hot showers, which can make itching worse. A cold shower may help with itching as long as you use a moisturizer after the shower. SEEK MEDICAL CARE IF: The itching does not go away after several days. Document Released: 10/18/2010 Document Revised: 04/30/2011 Document Reviewed: 10/18/2010 [facility] Patient Information 2014 [place], [place]. Intertrigo Intertrigo is a skin condition that occurs in between folds of skin in places on the body that rub together a lot and do not get much ventilation. It is caused by heat, moisture, friction, sweat retention, and lack of air circulation, which produces red, irritated patches and, sometimes, scaling or drainage. People who have diabetes, who are obese, or who have treatment with antibiotics are at increased risk for intertrigo. The most common sites for intertrigo to occur include:  The groin.  The breasts.  The armpits.  Folds  of abdominal skin.  Webbed spaces between the fingers or toes. Intertrigo may be aggravated by:  Sweat.  Feces.  Yeast or bacteria that are present near skin folds.  Urine.  Vaginal discharge. HOME CARE INSTRUCTIONS  The following steps can be taken to reduce friction and keep the affected area cool and dry:  Expose skin folds to the air.  Keep deep skin folds separated with cotton or linen cloth. Avoid tight fitting clothing that could cause chafing.  Wear open-toed shoes or sandals to help reduce moisture between the toes.  Apply absorbent powders to affected areas as directed by your caregiver.  Apply over-the-counter barrier pastes, such as zinc oxide, as directed by your caregiver.  If you develop a fungal infection in the affected area, your caregiver may have you use antifungal creams. SEEK MEDICAL CARE IF:   The rash is not improving after 1 week of treatment.  The rash is getting worse (more red, more swollen, more painful, or spreading).  You have a fever or chills. MAKE SURE YOU:   Understand these instructions.  Will watch your condition.  Will get help right away if you are not doing well or get worse. Document Released: 02/05/2005 Document Revised: 04/30/2011 Document Reviewed: 07/21/2009 [facility] Patient Information 2014 [place], [place]. Hemorrhoids Hemorrhoids are swollen veins around the rectum or anus. There are two types of hemorrhoids:   Internal hemorrhoids. These occur in the veins just inside the rectum. They may poke through to the outside and become irritated and painful.  External hemorrhoids. These occur in the veins outside the anus and can be felt as  a painful swelling or hard lump near the anus. CAUSES  Pregnancy.   Obesity.   Constipation or diarrhea.   Straining to have a bowel movement.   Sitting for long periods on the toilet.  Heavy lifting or other activity that caused you to strain.  Anal intercourse. SYMPTOMS    Pain.   Anal itching or irritation.   Rectal bleeding.   Fecal leakage.   Anal swelling.   One or more lumps around the anus.  DIAGNOSIS  Your caregiver may be able to diagnose hemorrhoids by visual examination. Other examinations or tests that may be performed include:   Examination of the rectal area with a gloved hand (digital rectal exam).   Examination of anal canal using a small tube (scope).   A blood test if you have lost a significant amount of blood.  A test to look inside the colon (sigmoidoscopy or colonoscopy). TREATMENT Most hemorrhoids can be treated at home. However, if symptoms do not seem to be getting better or if you have a lot of rectal bleeding, your caregiver may perform a procedure to help make the hemorrhoids get smaller or remove them completely. Possible treatments include:   Placing a rubber band at the base of the hemorrhoid to cut off the circulation (rubber band ligation).   Injecting a chemical to shrink the hemorrhoid (sclerotherapy).   Using a tool to burn the hemorrhoid (infrared light therapy).   Surgically removing the hemorrhoid (hemorrhoidectomy).   Stapling the hemorrhoid to block blood flow to the tissue (hemorrhoid stapling).  HOME CARE INSTRUCTIONS   Eat foods with fiber, such as whole grains, beans, nuts, fruits, and vegetables. Ask your doctor about taking products with added fiber in them (fibersupplements).  Increase fluid intake. Drink enough water and fluids to keep your urine clear or pale yellow.   Exercise regularly.   Go to the bathroom when you have the urge to have a bowel movement. Do not wait.   Avoid straining to have bowel movements.   Keep the anal area dry and clean. Use wet toilet paper or moist towelettes after a bowel movement.   Medicated creams and suppositories may be used or applied as directed.   Only take over-the-counter or prescription medicines as directed by your  caregiver.   Take warm sitz baths for 15 20 minutes, 3 4 times a day to ease pain and discomfort.   Place ice packs on the hemorrhoids if they are tender and swollen. Using ice packs between sitz baths may be helpful.   Put ice in a plastic bag.   Place a towel between your skin and the bag.   Leave the ice on for 15 20 minutes, 3 4 times a day.   Do not use a donut-shaped pillow or sit on the toilet for long periods. This increases blood pooling and pain.  SEEK MEDICAL CARE IF:  You have increasing pain and swelling that is not controlled by treatment or medicine.  You have uncontrolled bleeding.  You have difficulty or you are unable to have a bowel movement.  You have pain or inflammation outside the area of the hemorrhoids. MAKE SURE YOU:  Understand these instructions.  Will watch your condition.  Will get help right away if you are not doing well or get worse. Document Released: 02/03/2000 Document Revised: 01/23/2012 Document Reviewed: 12/11/2011 [facility] Patient Information 2014 [place], [place].  Smoking Cessation, Tips for Success YOU CAN QUIT SMOKING If you are ready to quit smoking,  congratulations! You have chosen to help yourself be healthier. Cigarettes bring nicotine, tar, carbon monoxide, and other irritants into your body. Your lungs, heart, and blood vessels will be able to work better without these poisons. There are many different ways to quit smoking. Nicotine gum, nicotine patches, a nicotine inhaler, or nicotine nasal spray can help with physical craving. Hypnosis, support groups, and medicines help break the habit of smoking. Here are some tips to help you quit for good.  Throw away all cigarettes.  Clean and remove all ashtrays from your home, work, and car.  On a card, write down your reasons for quitting. Carry the card with you and read it when you get the urge to smoke.  Cleanse your body of nicotine. Drink enough water and fluids to keep  your urine clear or pale yellow. Do this after quitting to flush the nicotine from your body.  Learn to predict your moods. Do not let a bad situation be your excuse to have a cigarette. Some situations in your life might tempt you into wanting a cigarette.  Never have "just one" cigarette. It leads to wanting another and another. Remind yourself of your decision to quit.  Change habits associated with smoking. If you smoked while driving or when feeling stressed, try other activities to replace smoking. Stand up when drinking your coffee. Brush your teeth after eating. Sit in a different chair when you read the paper. Avoid alcohol while trying to quit, and try to drink fewer caffeinated beverages. Alcohol and caffeine may urge you to smoke.  Avoid foods and drinks that can trigger a desire to smoke, such as sugary or spicy foods and alcohol.  Ask people who smoke not to smoke around you.  Have something planned to do right after eating or having a cup of coffee. Take a walk or exercise to perk you up. This will help to keep you from overeating.  Try a relaxation exercise to calm you down and decrease your stress. Remember, you may be tense and nervous for the first 2 weeks after you quit, but this will pass.  Find new activities to keep your hands busy. Play with a pen, coin, or rubber band. Doodle or draw things on paper.  Brush your teeth right after eating. This will help cut down on the craving for the taste of tobacco after meals. You can try mouthwash, too.  Use oral substitutes, such as lemon drops, carrots, a cinnamon stick, or chewing gum, in place of cigarettes. Keep them handy so they are available when you have the urge to smoke.  When you have the urge to smoke, try deep breathing.  Designate your home as a nonsmoking area.  If you are a heavy smoker, ask your caregiver about a prescription for nicotine chewing gum. It can ease your withdrawal from nicotine.  Reward  yourself. Set aside the cigarette money you save and buy yourself something nice.  Look for support from others. Join a support group or smoking cessation program. Ask someone at home or at work to help you with your plan to quit smoking.  Always ask yourself, "Do I need this cigarette or is this just a reflex?" Tell yourself, "Today, I choose not to smoke," or "I do not want to smoke." You are reminding yourself of your decision to quit, even if you do smoke a cigarette. HOW WILL I FEEL WHEN I QUIT SMOKING?  The benefits of not smoking start within days of quitting.  You may have symptoms of withdrawal because your body is used to nicotine (the addictive substance in cigarettes). You may crave cigarettes, be irritable, feel very hungry, cough often, get headaches, or have difficulty concentrating.  The withdrawal symptoms are only temporary. They are strongest when you first quit but will go away within 10 to 14 days.  When withdrawal symptoms occur, stay in control. Think about your reasons for quitting. Remind yourself that these are signs that your body is healing and getting used to being without cigarettes.  Remember that withdrawal symptoms are easier to treat than the major diseases that smoking can cause.  Even after the withdrawal is over, expect periodic urges to smoke. However, these cravings are generally short-lived and will go away whether you smoke or not. Do not smoke!  If you relapse and smoke again, do not lose hope. Most smokers quit 3 times before they are successful.  If you relapse, do not give up! Plan ahead and think about what you will do the next time you get the urge to smoke. LIFE AS A NONSMOKER: MAKE IT FOR A MONTH, MAKE IT FOR LIFE Day 1: Hang this page where you will see it every day. Day 2: Get rid of all ashtrays, matches, and lighters. Day 3: Drink water. Breathe deeply between sips. Day 4: Avoid places with smoke-filled air, such as bars, clubs, or the  smoking section of restaurants. Day 5: Keep track of how much money you save by not smoking. Day 6: Avoid boredom. Keep a good book with you or go to the movies. Day 7: Reward yourself! One week without smoking! Day 8: Make a dental appointment to get your teeth cleaned. Day 9: Decide how you will turn down a cigarette before it is offered to you. Day 10: Review your reasons for quitting. Day 11: Distract yourself. Stay active to keep your mind off smoking and to relieve tension. Take a walk, exercise, read a book, do a crossword puzzle, or try a new hobby. Day 12: Exercise. Get off the bus before your stop or use stairs instead of escalators. Day 13: Call on friends for support and encouragement. Day 14: Reward yourself! Two weeks without smoking! Day 15: Practice deep breathing exercises. Day 16: Bet a friend that you can stay a nonsmoker. Day 17: Ask to sit in nonsmoking sections of restaurants. Day 18: Hang up "No Smoking" signs. Day 19: Think of yourself as a nonsmoker. Day 20: Each morning, tell yourself you will not smoke. Day 21: Reward yourself! Three weeks without smoking! Day 22: Think of smoking in negative ways. Remember how it stains your teeth, gives you bad breath, and leaves you short of breath. Day 23: Eat a nutritious breakfast. Day 24:Do not relive your days as a smoker. Day 25: Hold a pencil in your hand when talking on the telephone. Day 26: Tell all your friends you do not smoke. Day 27: Think about how much better food tastes. Day 28: Remember, one cigarette is one too many. Day 29: Take up a hobby that will keep your hands busy. Day 30: Congratulations! One month without smoking! Give yourself a big reward. Your caregiver can direct you to community resources or hospitals for support, which may include:  Group support.  Education.  Hypnosis.  Subliminal therapy. Document Released: 11/04/2003 Document Revised: 04/30/2011 Document Reviewed:  11/22/2008 [facility] Patient Information 2014 [place], [place]. Smoking Cessation Quitting smoking is important to your health and has many advantages. However, it is  not always easy to quit since nicotine is a very addictive drug. Often times, people try 3 times or more before being able to quit. This document explains the best ways for you to prepare to quit smoking. Quitting takes hard work and a lot of effort, but you can do it. ADVANTAGES OF QUITTING SMOKING  You will live longer, feel better, and live better.  Your body will feel the impact of quitting smoking almost immediately.  Within 20 minutes, blood pressure decreases. Your pulse returns to its normal level.  After 8 hours, carbon monoxide levels in the blood return to normal. Your oxygen level increases.  After 24 hours, the chance of having a heart attack starts to decrease. Your breath, hair, and body stop smelling like smoke.  After 48 hours, damaged nerve endings begin to recover. Your sense of taste and smell improve.  After 72 hours, the body is virtually free of nicotine. Your bronchial tubes relax and breathing becomes easier.  After 2 to 12 weeks, lungs can hold more air. Exercise becomes easier and circulation improves.  The risk of having a heart attack, stroke, cancer, or lung disease is greatly reduced.  After 1 year, the risk of coronary heart disease is cut in half.  After 5 years, the risk of stroke falls to the same as a nonsmoker.  After 10 years, the risk of lung cancer is cut in half and the risk of other cancers decreases significantly.  After 15 years, the risk of coronary heart disease drops, usually to the level of a nonsmoker.  If you are pregnant, quitting smoking will improve your chances of having a healthy baby.  The people you live with, especially any children, will be healthier.  You will have extra money to spend on things other than cigarettes. QUESTIONS TO THINK ABOUT BEFORE  ATTEMPTING TO QUIT You may want to talk about your answers with your caregiver.  Why do you want to quit?  If you tried to quit in the past, what helped and what did not?  What will be the most difficult situations for you after you quit? How will you plan to handle them?  Who can help you through the tough times? Your family? Friends? A caregiver?  What pleasures do you get from smoking? What ways can you still get pleasure if you quit? Here are some questions to ask your caregiver:  How can you help me to be successful at quitting?  What medicine do you think would be best for me and how should I take it?  What should I do if I need more help?  What is smoking withdrawal like? How can I get information on withdrawal? GET READY  Set a quit date.  Change your environment by getting rid of all cigarettes, ashtrays, matches, and lighters in your home, car, or work. Do not let people smoke in your home.  Review your past attempts to quit. Think about what worked and what did not. GET SUPPORT AND ENCOURAGEMENT You have a better chance of being successful if you have help. You can get support in many ways.  Tell your family, friends, and co-workers that you are going to quit and need their support. Ask them not to smoke around you.  Get individual, group, or telephone counseling and support. Programs are available at [employer] and health centers. Call your local health department for information about programs in your area.  Spiritual beliefs and practices may help some smokers quit.  Download a "quit meter" on your computer to keep track of quit statistics, such as how long you have gone without smoking, cigarettes not smoked, and money saved.  Get a self-help book about quitting smoking and staying off of tobacco. LEARN NEW SKILLS AND BEHAVIORS  Distract yourself from urges to smoke. Talk to someone, go for a walk, or occupy your time with a task.  Change your normal  routine. Take a different route to work. Drink tea instead of coffee. Eat breakfast in a different place.  Reduce your stress. Take a hot bath, exercise, or read a book.  Plan something enjoyable to do every day. Reward yourself for not smoking.  Explore interactive web-based programs that specialize in helping you quit. GET MEDICINE AND USE IT CORRECTLY Medicines can help you stop smoking and decrease the urge to smoke. Combining medicine with the above behavioral methods and support can greatly increase your chances of successfully quitting smoking.  Nicotine replacement therapy helps deliver nicotine to your body without the negative effects and risks of smoking. Nicotine replacement therapy includes nicotine gum, lozenges, inhalers, nasal sprays, and skin patches. Some may be available over-the-counter and others require a prescription.  Antidepressant medicine helps people abstain from smoking, but how this works is unknown. This medicine is available by prescription.  Nicotinic receptor partial agonist medicine simulates the effect of nicotine in your brain. This medicine is available by prescription. Ask your caregiver for advice about which medicines to use and how to use them based on your health history. Your caregiver will tell you what side effects to look out for if you choose to be on a medicine or therapy. Carefully read the information on the package. Do not use any other product containing nicotine while using a nicotine replacement product.  RELAPSE OR DIFFICULT SITUATIONS Most relapses occur within the first 3 months after quitting. Do not be discouraged if you start smoking again. Remember, most people try several times before finally quitting. You may have symptoms of withdrawal because your body is used to nicotine. You may crave cigarettes, be irritable, feel very hungry, cough often, get headaches, or have difficulty concentrating. The withdrawal symptoms are only temporary.  They are strongest when you first quit, but they will go away within 10 14 days. To reduce the chances of relapse, try to:  Avoid drinking alcohol. Drinking lowers your chances of successfully quitting.  Reduce the amount of caffeine you consume. Once you quit smoking, the amount of caffeine in your body increases and can give you symptoms, such as a rapid heartbeat, sweating, and anxiety.  Avoid smokers because they can make you want to smoke.  Do not let weight gain distract you. Many smokers will gain weight when they quit, usually less than 10 pounds. Eat a healthy diet and stay active. You can always lose the weight gained after you quit.  Find ways to improve your mood other than smoking. FOR MORE INFORMATION  www.smokefree.gov  Document Released: 01/30/2001 Document Revised: 08/07/2011 Document Reviewed: 05/17/2011 [facility] Patient Information 2014 [place], [place].

## 2012-09-24 ENCOUNTER — Encounter: Payer: Medicaid Other | Admitting: Obstetrics & Gynecology

## 2012-09-29 ENCOUNTER — Encounter: Payer: Medicaid Other | Admitting: Obstetrics & Gynecology

## 2012-09-29 ENCOUNTER — Ambulatory Visit: Payer: Medicaid Other | Admitting: Internal Medicine

## 2012-11-05 ENCOUNTER — Ambulatory Visit (INDEPENDENT_AMBULATORY_CARE_PROVIDER_SITE_OTHER): Payer: Medicaid Other | Admitting: Obstetrics & Gynecology

## 2012-11-05 ENCOUNTER — Encounter: Payer: Self-pay | Admitting: Obstetrics & Gynecology

## 2012-11-05 VITALS — BP 89/62 | HR 85 | Ht 67.0 in | Wt 112.6 lb

## 2012-11-05 DIAGNOSIS — B373 Candidiasis of vulva and vagina: Secondary | ICD-10-CM

## 2012-11-05 DIAGNOSIS — N72 Inflammatory disease of cervix uteri: Secondary | ICD-10-CM

## 2012-11-05 DIAGNOSIS — L309 Dermatitis, unspecified: Secondary | ICD-10-CM

## 2012-11-05 MED ORDER — FLUCONAZOLE 150 MG PO TABS: 150.0000 mg | ORAL_TABLET | ORAL | Status: DC

## 2012-11-05 MED ORDER — CLOTRIMAZOLE-BETAMETHASONE 1-0.05 % EX CREA: TOPICAL_CREAM | Freq: Two times a day (BID) | CUTANEOUS | Status: DC

## 2012-11-05 NOTE — Patient Instructions (Addendum)
Monilial Vaginitis  Vaginitis in a soreness, swelling and redness (inflammation) of the vagina and vulva. Monilial vaginitis is not a sexually transmitted infection.  CAUSES   Yeast vaginitis is caused by yeast (candida) that is normally found in your vagina. With a yeast infection, the candida has overgrown in number to a point that upsets the chemical balance.  SYMPTOMS   · White, thick vaginal discharge.  · Swelling, itching, redness and irritation of the vagina and possibly the lips of the vagina (vulva).  · Burning or painful urination.  · Painful intercourse.  DIAGNOSIS   Things that may contribute to monilial vaginitis are:  · Postmenopausal and virginal states.  · Pregnancy.  · Infections.  · Being tired, sick or stressed, especially if you had monilial vaginitis in the past.  · Diabetes. Good control will help lower the chance.  · Birth control pills.  · Tight fitting garments.  · Using bubble bath, feminine sprays, douches or deodorant tampons.  · Taking certain medications that kill germs (antibiotics).  · Sporadic recurrence can occur if you become ill.  TREATMENT   Your caregiver will give you medication.  · There are several kinds of anti monilial vaginal creams and suppositories specific for monilial vaginitis. For recurrent yeast infections, use a suppository or cream in the vagina 2 times a week, or as directed.  · Anti-monilial or steroid cream for the itching or irritation of the vulva may also be used. Get your caregiver's permission.  · Painting the vagina with methylene blue solution may help if the monilial cream does not work.  · Eating yogurt may help prevent monilial vaginitis.  HOME CARE INSTRUCTIONS   · Finish all medication as prescribed.  · Do not have sex until treatment is completed or after your caregiver tells you it is okay.  · Take warm sitz baths.  · Do not douche.  · Do not use tampons, especially scented ones.  · Wear cotton underwear.  · Avoid tight pants and panty  hose.  · Tell your sexual partner that you have a yeast infection. They should go to their caregiver if they have symptoms such as mild rash or itching.  · Your sexual partner should be treated as well if your infection is difficult to eliminate.  · Practice safer sex. Use condoms.  · Some vaginal medications cause latex condoms to fail. Vaginal medications that harm condoms are:  · Cleocin cream.  · Butoconazole (Femstat®).  · Terconazole (Terazol®) vaginal suppository.  · Miconazole (Monistat®) (may be purchased over the counter).  SEEK MEDICAL CARE IF:   · You have a temperature by mouth above 102° F (38.9° C).  · The infection is getting worse after 2 days of treatment.  · The infection is not getting better after 3 days of treatment.  · You develop blisters in or around your vagina.  · You develop vaginal bleeding, and it is not your menstrual period.  · You have pain when you urinate.  · You develop intestinal problems.  · You have pain with sexual intercourse.  Document Released: 11/15/2004 Document Revised: 04/30/2011 Document Reviewed: 07/30/2008  [employer]® Patient Information ©2014 [employer].

## 2012-11-05 NOTE — Progress Notes (Signed)
Pt has a rash in her vaginal area, that is itchy. Doesn't look like ingrown hairs to her. She's been using otc clotrimazole and says it helps some but never makes it totally better.

## 2012-11-05 NOTE — Progress Notes (Signed)
Subjective:     Patient ID: Vicki Andrews, female   DOB: 08-Dec-1965, 47 y.o.   MRN: [id number]  HPI  Pt c/o persistent BV since 2006 or 2008.  She is not sure if sx are resolved at present but, she c/o itching today.  Pt shaves using a clipper with Safeguard soap.  She also takes Hydroxyzine for itching.  She reports being very disturbed by her sx and reports that they have interfered with her relationships and her quality of life.  Review of Systems     Objective:   Physical Exam BP 89/62  Pulse 85  Ht 5\' 7"  (1.702 m)  Wt 112 lb 9.6 oz (51.075 kg)  BMI 17.63 kg/m2  LMP 10/25/2012 Pt in NAD GU: EGBUS: on right side of mons pubis there is an excoriated region about 3x4 cm in diameter. The mons is otherwise clear with no skin discoloration   Vagina: no blood in vault; thick white vaginal discharge c/w yeast Cervix: no lesion; no mucopurulent d/c          Assessment:     Yeast vaginitis Vulvar dermatitis     Plan:     Diflucan 150mg  1 po qod x 2 doses lotrisone apple to right side of vulva tid until clear F/u 4 weeks or sooner prn Dove unscented soap only DISCONTINUE all OTC topical GYN products Begin probiotic tablet daily (may purchase OTC)     f/u wet smear

## 2012-11-06 LAB — WET PREP, GENITAL
Trich, Wet Prep: NONE SEEN
Yeast Wet Prep HPF POC: NONE SEEN

## 2012-11-11 ENCOUNTER — Telehealth: Payer: Self-pay | Admitting: General Practice

## 2012-11-11 NOTE — Telephone Encounter (Signed)
Message copied by Kathee Delton on Tue Nov 11, 2012 11:19 AM ------      Message from: Willodean Rosenthal      Created: Fri Nov 07, 2012 11:29 AM       Please call pt.  Her wet smear was negative for yeast or BV.            She is to f/u as scheduled.                  clh-S ------

## 2012-11-11 NOTE — Telephone Encounter (Signed)
Called patient and informed her of results and asked if she had another appt scheduled with [place]. Patient verbalized understanding and stated they had not called her back yet with a follow up appt. Informed patient of new follow up appt for 10/17 at 9:30. Patient verbalized understanding of appt and had no further questions

## 2012-11-27 ENCOUNTER — Encounter: Payer: Self-pay | Admitting: *Deleted

## 2012-11-27 ENCOUNTER — Ambulatory Visit (INDEPENDENT_AMBULATORY_CARE_PROVIDER_SITE_OTHER): Payer: Medicaid Other | Admitting: Internal Medicine

## 2012-11-27 ENCOUNTER — Encounter: Payer: Self-pay | Admitting: Internal Medicine

## 2012-11-27 VITALS — BP 90/64 | HR 83 | Ht 67.0 in | Wt 112.4 lb

## 2012-11-27 DIAGNOSIS — I452 Bifascicular block: Secondary | ICD-10-CM

## 2012-11-27 DIAGNOSIS — R079 Chest pain, unspecified: Secondary | ICD-10-CM

## 2012-11-27 DIAGNOSIS — R42 Dizziness and giddiness: Secondary | ICD-10-CM

## 2012-11-27 NOTE — Patient Instructions (Addendum)
Your physician has requested that you have a stress echocardiogram. For further information please visit [email or web address] Please follow instruction sheet as given.  Your physician recommends that you return for fasting lab work the same day as your stress echo. Fasting cholesterol panel

## 2012-11-27 NOTE — Progress Notes (Signed)
HPI Patient is a 47 yo who was referred for evaluation of RBBB   The patient was seen in the ER fro gynecologic problems  At that time she complained of CP  She described it as a substernal stabbing  Lasted about 15 to 20 sec  Stopped  Came again later in day.  MOstly occur with stress  No CP with activity With these complaints she had an ecg done that showed RBBB.  In august she had a repeat EKG  Unchanged.   She does not occasional flasshes of light  No syncope  Is dizz at times.   No Known Allergies  Current Outpatient Prescriptions  Medication Sig Dispense Refill  . clotrimazole (LOTRIMIN) 1 % cream Apply 1 application topically as needed.       No current facility-administered medications for this visit.    Past Medical History  Diagnosis Date  . Asthma   . Anemia     Past Surgical History  Procedure Laterality Date  . Btl    . Tubal ligation      Family History  Problem Relation Age of Onset  . Colon cancer Father   . Crohn's disease Father   . Diabetes Father   . Hypertension Mother     History   Social History  . Marital Status: Single    Spouse Name: N/A    Number of Children: 5  . Years of Education: N/A   Occupational History  . Laborer    Social History Main Topics  . Smoking status: Current Every Day Smoker -- 0.50 packs/day  . Smokeless tobacco: Never Used  . Alcohol Use: Yes     Comment: socially  . Drug Use: No  . Sexual Activity: No   Other Topics Concern  . Not on file   Social History Narrative  . No narrative on file    Review of Systems:  All systems reviewed.  They are negative to the above problem except as previously stated.  Vital Signs: BP 90/64  Pulse 83  Ht 5\' 7"  (1.702 m)  Wt 112 lb 6.4 oz (50.984 kg)  BMI 17.6 kg/m2  SpO2 99%  LMP 10/25/2012  Physical Exam Patient is in NAD HEENT:  Normocephalic, atraumatic. EOMI, PERRLA.  Neck: JVP is normal.  No bruits.  Lungs: clear to auscultation. No rales no wheezes.   Heart: Regular rate and rhythm. Normal S1, S2. No S3.   No significant murmurs. PMI not displaced.  Abdomen:  Supple, nontender. Normal bowel sounds. No masses. No hepatomegaly.  Extremities:   Good distal pulses throughout. No lower extremity edema.  Musculoskeletal :moving all extremities.  Neuro:   alert and oriented x3.  CN II-XII grossly intact.  EKG SR 66  RBBB (09/23/12) Assessment and Plan: 1  RBBB  Would recomm an echo  With history of stabbing pain and also flashes of light (? Presyncope) I would recomm a stress echo  2.  Tob  COunselled on cessation.

## 2012-12-05 ENCOUNTER — Encounter: Payer: Self-pay | Admitting: Obstetrics & Gynecology

## 2012-12-05 ENCOUNTER — Ambulatory Visit (INDEPENDENT_AMBULATORY_CARE_PROVIDER_SITE_OTHER): Payer: Medicaid Other | Admitting: Obstetrics & Gynecology

## 2012-12-05 ENCOUNTER — Other Ambulatory Visit (INDEPENDENT_AMBULATORY_CARE_PROVIDER_SITE_OTHER): Payer: Medicaid Other

## 2012-12-05 ENCOUNTER — Ambulatory Visit (HOSPITAL_COMMUNITY)
Admission: RE | Admit: 2012-12-05 | Discharge: 2012-12-05 | Disposition: A | Discharge status: Home / Self Care | Payer: Medicaid Other | Source: Ambulatory Visit | Attending: Internal Medicine | Admitting: Internal Medicine

## 2012-12-05 VITALS — BP 100/64 | HR 64 | Temp 98.1°F | Wt 110.0 lb

## 2012-12-05 DIAGNOSIS — R42 Dizziness and giddiness: Secondary | ICD-10-CM | POA: Insufficient documentation

## 2012-12-05 DIAGNOSIS — N898 Other specified noninflammatory disorders of vagina: Secondary | ICD-10-CM

## 2012-12-05 DIAGNOSIS — F17200 Nicotine dependence, unspecified, uncomplicated: Secondary | ICD-10-CM | POA: Insufficient documentation

## 2012-12-05 DIAGNOSIS — R079 Chest pain, unspecified: Secondary | ICD-10-CM

## 2012-12-05 DIAGNOSIS — I452 Bifascicular block: Secondary | ICD-10-CM | POA: Insufficient documentation

## 2012-12-05 DIAGNOSIS — R072 Precordial pain: Secondary | ICD-10-CM

## 2012-12-05 DIAGNOSIS — N949 Unspecified condition associated with female genital organs and menstrual cycle: Secondary | ICD-10-CM

## 2012-12-05 LAB — LIPID PANEL
HDL: 39.5 mg/dL (ref >39.00)
LDL Cholesterol: 94 mg/dL (ref 0–99)
Total CHOL/HDL Ratio: 4
Triglycerides: 51 mg/dL (ref 0.0–149.0)

## 2012-12-05 NOTE — Progress Notes (Signed)
Pt. C/o of yellow discharge, denies itchyness but states it smells funny. C/o of lower abdominal/pelvic pain.

## 2012-12-05 NOTE — Patient Instructions (Signed)
Disorders of the Vulva It is important to know the outside (external) area of the female genitalia to properly examine yourself. The vulva or labia, sometimes also called the lips around the vagina, covers the pubic bone and surrounds the vagina. The larger or outer labia is called the labia majora. The inner or smaller labia is called the labia minora. The clitoris is at the top of the vulva. It is covered by a small area of tissue called the hood. Below the clitoris is the opening of the tube from the bladder, which is the urethral opening. Just below the urethral opening is the opening of the vagina. This area is called the vestibule. Inside the vestibule are bartholin and skene glands that lubricate the outside of the vagina during sexual intercourse. The perineum is the area that lies between the vagina and anus. You should examine your external genitalia once a month just as you do your self breast examination. SIGNS AND SYMPTOMS TO LOOK FOR DURING YOUR EXAMINATION:  Swelling.  Redness.  Bumps.  Blisters.  Black or white areas.  Itching.  Bleeding.  Burning. TYPES OF VULVAR PROBLEMS  Infections.  Yeast (fungus) is the most common infection that causes redness, swelling, itching and a thick white vaginal discharge. Women with diabetes, taking medicines that kill germs (antibiotics) or on birth control pills are at risk for yeast infection. This infection is treated with vaginal creams, suppositories and anti-itch cream for the vulva.  Genital warts (condyloma) are caused by the human papilloma virus. They are raised bumps that can itch, bleed, cause discomfort and sometimes appear in groups like cauliflower. This is a sexually transmitted disease (STD) caused by sexual contact. This can be treated with topical medication, freezing, electrocautery, laser or surgical removal.  Genital herpes is a virus that causes redness, swelling, blisters and ulcers that can cause itching and are  very painful. This is also a STD. There are medications to control the symptoms of herpes, but there is no cure. Once you have it, you keep getting it because the virus stays in your body.  Contact dermatitis. Contact dermatitis is an irritation of the vulva that can cause redness, swelling and itching. It can be caused by:  Perfumed toilet paper.  Deodorants.  Talcum powder.  Hygiene sprays.  Tampons.  Soaps and fabric softeners.  Wearing wet underwear and bathing suits too long.  Spermicide.  Condoms.  Diaphragms.  Poison ivy. Treatment will depend on eliminating the cause and treating the symptoms.  Vulvodynia.  Vulvodynia is "painful vulva." It includes burning, itching, irritation and a feeling of rawness or bruising of the vulva. Treating this problem depends on the cause and diagnosis. Treatment can take a long time.  Vulvar Dystrophy.  Vulvar Dystrophy is a disorder of the skin of the vulva. The skin can be too thick with hard raised patches or too thin skin showing wrinkles. Vulvar dystrophy can cause redness or white patches, itching and burning sensation. A biopsy may be needed to diagnose the problem. Treatment with creams or ointments depends on the diagnosis.  Vulvar Cancer.  Vulvar cancer is usually a form of squamus skin cancer. Other types of vulvar cancer like melenoma (a dark or black, irregular shaped mole that can bleed easily) and adenocarcinoma (red, itchy, scaly area that looks like eczema) are rare. Treatment is usually surgery to remove the cancerous area, the vulva and the lymph nodes in the groin. This can be done with or without radiation and chemotherapy. HOME   CARE INSTRUCTIONS   Look at your vulva and external genital area every month.  Follow and finish all treatment given to you by your caregiver.  Do not use perfumed or scented soaps, detergents, hygiene spray, talcum powder, tampons, douches or toilet paper.  Remove your tampon every 6  hours. Do not leave tampons in overnight.  Wear loose-fitting clothing.  Wear underwear that is cotton.  Avoid spermicidal creams or foams that irritate you. SEEK MEDICAL CARE IF:   You notice changes on your vulva such as redness, swelling, itching, color changes, bleeding, small bumps, blisters or any discomfort.  You develop an abnormal vaginal discharge.  You have painful sexual intercourse.  You notice swelling of the lymph nodes in your groin. Document Released: 07/25/2007 Document Revised: 04/30/2011 Document Reviewed: 05/08/2010 [employer] Patient Information 2014 [employer].  

## 2012-12-05 NOTE — Progress Notes (Signed)
  Echocardiogram Echocardiogram Stress Test has been performed.  Vicki Andrews 12/05/2012, 2:32 PM

## 2012-12-05 NOTE — Addendum Note (Signed)
Addended by: Franchot Mimes on: 12/05/2012 11:16 AM   Modules accepted: Orders

## 2012-12-05 NOTE — Progress Notes (Signed)
Subjective:     Patient ID: Vicki Andrews, female   DOB: [birth year], 47 y.o.   MRN: [id number]  HPI Pt reports that she is not having pain or itching but she has an odor.  She has been douching.    Review of Systems     Objective:   Physical Exam BP 100/64  Pulse 64  Temp(Src) 98.1 F (36.7 C) (Oral)  Wt 110 lb (49.896 kg)  BMI 17.22 kg/m2  LMP 10/25/2012  Pt in NAD GU: EGBUS: no lesions Vagina: no blood in vault; thick white discharge suspect physiologic Cervix: no lesion; no mucopurulent d/c         Assessment:     vaginal odor- suspect related to the substances that the pt is using.     Plan:     F/u wet mount Review with pt GO WHITE- d/c all perfumed substance; cotton only underwear Pt wants to f/u in 4 weeks

## 2012-12-06 LAB — WET PREP, GENITAL: Yeast Wet Prep HPF POC: NONE SEEN

## 2012-12-08 ENCOUNTER — Encounter: Payer: Self-pay | Admitting: *Deleted

## 2012-12-08 ENCOUNTER — Telehealth: Payer: Self-pay | Admitting: *Deleted

## 2012-12-08 NOTE — Telephone Encounter (Addendum)
Message copied by Jill Side on Mon Dec 08, 2012  4:16 PM ------      Message from: Willodean Rosenthal      Created: Mon Dec 08, 2012  7:20 AM       Please call pt and inform her tha there wet mount is COMPLETELY normal.            Reinforce that pt should NOT douche and follow the instructions for avoiding all perfumed substances or chemical in her genital area.            Thx,      clh-S  ------ Called pt and left message that I am calling with test results and a message from the doctor. Please call back and leave a message on the nurse voice mail indicating whether we may leave the results information on her voice mail if she does not answer our future call.   10/21  1223  Pt returned my call and left message stating that detailed information may be left on her voice mail.  I called her and left message of the above test results and information from Dr. Erin Fulling. She may call back if she has questions.

## 2012-12-09 ENCOUNTER — Telehealth: Payer: Self-pay | Admitting: *Deleted

## 2012-12-09 NOTE — Telephone Encounter (Signed)
Per dr Tenny Craw, Stress echo is normal  Excellent exercise capacity  HR increased normally  Echo was normal  I do not think her CP is from cardiac problem.  RBBB on ekg has not evid to be a problem.  Not a marker for heart problems.  I would not schedule any further testing  Forward to primary MD.  Left message for pt to call

## 2012-12-10 NOTE — Telephone Encounter (Signed)
Follow up     Pt would like a call back @ 4:35pm and after for her test results please.

## 2012-12-10 NOTE — Telephone Encounter (Signed)
Left message for patient to call back for test results.

## 2012-12-24 ENCOUNTER — Ambulatory Visit: Payer: Medicaid Other

## 2012-12-24 NOTE — Telephone Encounter (Signed)
Spoke with pt, aware of stress echo results. 

## 2013-04-13 ENCOUNTER — Encounter (HOSPITAL_COMMUNITY): Payer: Self-pay | Admitting: Emergency Medicine

## 2013-04-13 ENCOUNTER — Emergency Department (HOSPITAL_COMMUNITY)
Admission: EM | Admit: 2013-04-13 | Discharge: 2013-04-13 | Disposition: A | Discharge status: Home / Self Care | Payer: Medicaid Other | Source: Home / Self Care

## 2013-04-13 DIAGNOSIS — W57XXXA Bitten or stung by nonvenomous insect and other nonvenomous arthropods, initial encounter: Secondary | ICD-10-CM

## 2013-04-13 DIAGNOSIS — A084 Viral intestinal infection, unspecified: Secondary | ICD-10-CM

## 2013-04-13 DIAGNOSIS — A088 Other specified intestinal infections: Secondary | ICD-10-CM

## 2013-04-13 DIAGNOSIS — T148 Other injury of unspecified body region: Secondary | ICD-10-CM

## 2013-04-13 MED ORDER — HYDROXYZINE HCL 25 MG PO TABS: 25.0000 mg | ORAL_TABLET | Freq: Four times a day (QID) | ORAL | Status: DC | PRN

## 2013-04-13 MED ORDER — ONDANSETRON HCL 4 MG PO TABS: 4.0000 mg | ORAL_TABLET | Freq: Four times a day (QID) | ORAL | Status: DC

## 2013-04-13 NOTE — Discharge Instructions (Signed)
Bedbugs Bedbugs are tiny bugs that live in and around beds. During the day, they hide in mattresses and other places near beds. They come out at night and bite people lying in bed. They need blood to live and grow. Bedbugs can be found in beds anywhere. Usually, they are found in places where many people come and go (hotels, shelters, hospitals). It does not matter whether the place is dirty or clean. Getting bitten by bedbugs rarely causes a medical problem. The biggest problem can be getting rid of them. This often takes the work of a Financial risk analyst. CAUSES  Less use of pesticides. Bedbugs were common before the 1950s. Then, strong pesticides such as DDT nearly wiped them out. Today, these pesticides are not used because they harm the environment and can cause health problems.  More travel. Besides mattresses, bedbugs can also live in clothing and luggage. They can come along as people travel from place to place. Bedbugs are more common in certain parts of the world. When people travel to those areas, the bugs can come home with them.  Presence of birds and bats. Bedbugs often infest birds and bats. If you have these animals in or near your home, bedbugs may infest your house, too. SYMPTOMS It does not hurt to be bitten by a bedbug. You will probably not wake up when you are bitten. Bedbugs usually bite areas of the skin that are not covered. Symptoms may show when you wake up, or they may take a day or more to show up. Symptoms may include:  Small red bumps on the skin. These might be lined up in a row or clustered in a group.  A darker red dot in the middle of red bumps.  Blisters on the skin. There may be swelling and very bad itching. These may be signs of an allergic reaction. This does not happen often. DIAGNOSIS Bedbug bites might look and feel like other types of insect bites. The bugs do not stay on the body like ticks or lice. They bite, drop off, and crawl away to hide. Your  caregiver will probably:  Ask about your symptoms.  Ask about your recent activities and travel.  Check your skin for bedbug bites.  Ask you to check at home for signs of bedbugs. You should look for:  Spots or stains on the bed or nearby. This could be from bedbugs that were crushed or from their eggs or waste.  Bedbugs themselves. They are reddish-brown, oval, and flat. They do not fly. They are about the size of an apple seed.  Places to look for bedbugs include:  Beds. Check mattresses, headboards, box springs, and bed frames.  On drapes and curtains near the bed.  Under carpeting in the bedroom.  Behind electrical outlets.  Behind any wallpaper that is peeling.  Inside luggage. TREATMENT Most bedbug bites do not need treatment. They usually go away on their own in a few days. The bites are not dangerous. However, treatment may be needed if you have scratched so much that your skin has become infected. You may also need treatment if you are allergic to bedbug bites. Treatment options include:  A drug that stops swelling and itching (corticosteroid). Usually, a cream is rubbed on the skin. If you have a bad rash, you may be given a corticosteroid pill.  Oral antihistamines. These are pills to help control itching.  Antibiotic medicines. An antibiotic may be prescribed for infected skin. HOME CARE INSTRUCTIONS  Take any medicine prescribed by your caregiver for your bites. Follow the directions carefully.  Consider wearing pajamas with long sleeves and pant legs.  Your bedroom may need to be treated. A pest control expert should make sure the bedbugs are gone. You may need to throw away mattresses or luggage. Ask the pest control expert what you can do to keep the bedbugs from coming back. Common suggestions include:  Putting a plastic cover over your mattress.  Washing and drying your clothes and bedding in hot water and a hot dryer. The temperature should be hotter  than 120 F (48.9 C). Bedbugs are killed by high temperatures.  Vacuuming carefully all around your bed. Vacuum in all cracks and crevices where the bugs might hide. Do this often.  Carefully checking all used furniture, bedding, or clothes that you bring into your house.  Eliminating bird nests and bat roosts.  If you get bedbug bites when traveling, check all your possessions carefully before bringing them into your house. If you find any bugs on clothes or in your luggage, consider throwing those items away. SEEK MEDICAL CARE IF:  You have red bug bites that keep coming back.  You have red bug bites that itch badly.  You have bug bites that cause a skin rash.  You have scratch marks that are red and sore. SEEK IMMEDIATE MEDICAL CARE IF: You have a fever. Document Released: 03/10/2010 Document Revised: 04/30/2011 Document Reviewed: 03/10/2010 [facility] Patient Information 2014 [place], [place].  Viral Gastroenteritis Viral gastroenteritis is also known as stomach flu. This condition affects the stomach and intestinal tract. It can cause sudden diarrhea and vomiting. The illness typically lasts 3 to 8 days. Most people develop an immune response that eventually gets rid of the virus. While this natural response develops, the virus can make you quite ill. CAUSES  Many different viruses can cause gastroenteritis, such as rotavirus or noroviruses. You can catch one of these viruses by consuming contaminated food or water. You may also catch a virus by sharing utensils or other personal items with an infected person or by touching a contaminated surface. SYMPTOMS  The most common symptoms are diarrhea and vomiting. These problems can cause a severe loss of body fluids (dehydration) and a body salt (electrolyte) imbalance. Other symptoms may include:  Fever.  Headache.  Fatigue.  Abdominal pain. DIAGNOSIS  Your caregiver can usually diagnose viral gastroenteritis based on your  symptoms and a physical exam. A stool sample may also be taken to test for the presence of viruses or other infections. TREATMENT  This illness typically goes away on its own. Treatments are aimed at rehydration. The most serious cases of viral gastroenteritis involve vomiting so severely that you are not able to keep fluids down. In these cases, fluids must be given through an intravenous line (IV). HOME CARE INSTRUCTIONS   Drink enough fluids to keep your urine clear or pale yellow. Drink small amounts of fluids frequently and increase the amounts as tolerated.  Ask your caregiver for specific rehydration instructions.  Avoid:  Foods high in sugar.  Alcohol.  Carbonated drinks.  Tobacco.  Juice.  Caffeine drinks.  Extremely hot or cold fluids.  Fatty, greasy foods.  Too much intake of anything at one time.  Dairy products until 24 to 48 hours after diarrhea stops.  You may consume probiotics. Probiotics are active cultures of beneficial bacteria. They may lessen the amount and number of diarrheal stools in adults. Probiotics can be found in  yogurt with active cultures and in supplements.  Wash your hands well to avoid spreading the virus.  Only take over-the-counter or prescription medicines for pain, discomfort, or fever as directed by your caregiver. Do not give aspirin to children. Antidiarrheal medicines are not recommended.  Ask your caregiver if you should continue to take your regular prescribed and over-the-counter medicines.  Keep all follow-up appointments as directed by your caregiver. SEEK IMMEDIATE MEDICAL CARE IF:   You are unable to keep fluids down.  You do not urinate at least once every 6 to 8 hours.  You develop shortness of breath.  You notice blood in your stool or vomit. This may look like coffee grounds.  You have abdominal pain that increases or is concentrated in one small area (localized).  You have persistent vomiting or  diarrhea.  You have a fever.  The patient is a child younger than 3 months, and he or she has a fever.  The patient is a child older than 3 months, and he or she has a fever and persistent symptoms.  The patient is a child older than 3 months, and he or she has a fever and symptoms suddenly get worse.  The patient is a baby, and he or she has no tears when crying. MAKE SURE YOU:   Understand these instructions.  Will watch your condition.  Will get help right away if you are not doing well or get worse. Document Released: 02/05/2005 Document Revised: 04/30/2011 Document Reviewed: 11/22/2010 [facility] Patient Information 2014 [place], [place].  Viral Infections A virus is a type of germ. Viruses can cause:  Minor sore throats.  Aches and pains.  Headaches.  Runny nose.  Rashes.  Watery eyes.  Tiredness.  Coughs.  Loss of appetite.  Feeling sick to your stomach (nausea).  Throwing up (vomiting).  Watery poop (diarrhea). HOME CARE   Only take medicines as told by your doctor.  Drink enough water and fluids to keep your pee (urine) clear or pale yellow. Sports drinks are a good choice.  Get plenty of rest and eat healthy. Soups and broths with crackers or rice are fine. GET HELP RIGHT AWAY IF:   You have a very bad headache.  You have shortness of breath.  You have chest pain or neck pain.  You have an unusual rash.  You cannot stop throwing up.  You have watery poop that does not stop.  You cannot keep fluids down.  You or your child has a temperature by mouth above 102 F (38.9 C), not controlled by medicine.  Your baby is older than 3 months with a rectal temperature of 102 F (38.9 C) or higher.  Your baby is 1043 months old or younger with a rectal temperature of 100.4 F (38 C) or higher. MAKE SURE YOU:   Understand these instructions.  Will watch this condition.  Will get help right away if you are not doing well or get  worse. Document Released: 01/19/2008 Document Revised: 04/30/2011 Document Reviewed: 06/13/2010 [facility] Patient Information 2014 [place], [place].

## 2013-04-13 NOTE — ED Provider Notes (Signed)
CSN: [id number]     Arrival date & time 04/13/13  1138 History   First MD Initiated Contact with Patient 04/13/13 1308     Chief Complaint  Patient presents with  . Rash     (Consider location/radiation/quality/duration/timing/severity/associated sxs/prior Treatment) HPI Comments: 48 year old female awoke yesterday morning with diarrhea followed by vomiting. She has had several episodes of watery diarrhea associated with weakness, myalgias and feeling hungry. She is tried to eat food in the last few hours which causes her emesis. She is also complaining of small itchy sores to her torso and hips states she slept with a friend overnight couple weeks ago. She believes she has bedbugs.   Past Medical History  Diagnosis Date  . Asthma   . Anemia    Past Surgical History  Procedure Laterality Date  . Btl    . Tubal ligation     Family History  Problem Relation Age of Onset  . Colon cancer Father   . Crohn's disease Father   . Diabetes Father   . Hypertension Mother    History  Substance Use Topics  . Smoking status: Current Every Day Smoker -- 0.50 packs/day  . Smokeless tobacco: Never Used  . Alcohol Use: Yes     Comment: socially   OB History   Grav Para Term Preterm Abortions TAB SAB Ect Mult Living   5 5 5       5      Review of Systems  Constitutional: Positive for activity change and fatigue. Negative for fever.  HENT: Negative.   Respiratory: Negative.   Cardiovascular: Negative.   Gastrointestinal: Positive for nausea, vomiting and diarrhea. Negative for abdominal distention.       Mid abdominal cramps with BM's  Genitourinary: Negative.   Skin: Positive for rash.      Allergies  Review of patient's allergies indicates no known allergies.  Home Medications   Current Outpatient Rx  Name  Route  Sig  Dispense  Refill  . clotrimazole (LOTRIMIN) 1 % cream   Topical   Apply 1 application topically as needed.         . hydrOXYzine (ATARAX/VISTARIL) 25  MG tablet   Oral   Take 1 tablet (25 mg total) by mouth every 6 (six) hours as needed. For itching   20 tablet   0   . ondansetron (ZOFRAN) 4 MG tablet   Oral   Take 1 tablet (4 mg total) by mouth every 6 (six) hours.   12 tablet   0    BP 100/66  Pulse 88  Temp(Src) 98.1 F (36.7 C) (Oral)  Resp 14  SpO2 98% Physical Exam  Nursing note and vitals reviewed. Constitutional: She is oriented to person, place, and time. She appears well-developed and well-nourished. No distress.  Eyes: Conjunctivae and EOM are normal.  Neck: Normal range of motion. Neck supple.  Cardiovascular: Normal rate, regular rhythm and normal heart sounds.   Pulmonary/Chest: Effort normal and breath sounds normal. No respiratory distress.  Abdominal: Soft. Bowel sounds are normal. She exhibits no distension. There is no tenderness. There is no rebound and no guarding.  Musculoskeletal: She exhibits no edema and no tenderness.  Lymphadenopathy:    She has no cervical adenopathy.  Neurological: She is alert and oriented to person, place, and time.  Skin: Skin is warm and dry.  Several 3-4 mm dark annular pruritic lesions to the back, lesser to anterior torso and hips. No drainage or bleeding. No signs of  infection  Psychiatric: She has a normal mood and affect.    ED Course  Procedures (including critical care time) Labs Review Labs Reviewed - No data to display Imaging Review No results found.    MDM   Final diagnoses:  Viral gastroenteritis  Insect bites      zofran as dir Imodium AD 1 q 8 h to slow diarrhea down only Clear liquids in small frequent amounts Clean bedroom as per instructions Atarax for itching.  Hayden Rasmussenavid Tycho Cheramie, NP 04/13/13 1348

## 2013-04-13 NOTE — ED Notes (Signed)
Rash x 2 weeks (I think I have bed bugs) and diarrhea

## 2013-04-14 NOTE — ED Provider Notes (Signed)
Medical screening examination/treatment/procedure(s) were performed by non-physician practitioner and as supervising physician I was immediately available for consultation/collaboration.  Leslee Homeavid Venna Berberich, M.D.  Reuben Likesavid C Juventino Pavone, MD 04/14/13 1247

## 2013-07-03 ENCOUNTER — Other Ambulatory Visit: Payer: Self-pay | Admitting: Internal Medicine

## 2013-07-03 DIAGNOSIS — Z1231 Encounter for screening mammogram for malignant neoplasm of breast: Secondary | ICD-10-CM

## 2013-07-21 ENCOUNTER — Ambulatory Visit
Admission: RE | Admit: 2013-07-21 | Discharge: 2013-07-21 | Disposition: A | Discharge status: Home / Self Care | Payer: Medicaid Other | Source: Ambulatory Visit | Attending: Internal Medicine | Admitting: Internal Medicine

## 2013-07-21 ENCOUNTER — Encounter (INDEPENDENT_AMBULATORY_CARE_PROVIDER_SITE_OTHER): Payer: Self-pay

## 2013-07-21 DIAGNOSIS — Z1231 Encounter for screening mammogram for malignant neoplasm of breast: Secondary | ICD-10-CM

## 2013-12-21 ENCOUNTER — Encounter (HOSPITAL_COMMUNITY): Payer: Self-pay | Admitting: Emergency Medicine

## 2014-05-18 ENCOUNTER — Encounter: Payer: Self-pay | Admitting: *Deleted

## 2014-06-16 ENCOUNTER — Encounter: Payer: Self-pay | Admitting: Obstetrics & Gynecology

## 2014-06-16 ENCOUNTER — Ambulatory Visit (INDEPENDENT_AMBULATORY_CARE_PROVIDER_SITE_OTHER): Payer: Medicaid Other | Admitting: Obstetrics & Gynecology

## 2014-06-16 VITALS — BP 106/69 | HR 96 | Temp 98.4°F | Resp 20 | Ht 67.0 in | Wt 112.2 lb

## 2014-06-16 DIAGNOSIS — N760 Acute vaginitis: Secondary | ICD-10-CM

## 2014-06-16 MED ORDER — METRONIDAZOLE 0.75 % VA GEL: 1.0000 | Freq: Every day | VAGINAL | Status: DC

## 2014-06-16 MED ORDER — BORIC ACID CRYS: 600.0000 mg | CRYSTALS | Status: DC

## 2014-06-16 MED ORDER — FLUCONAZOLE 150 MG PO TABS: 150.0000 mg | ORAL_TABLET | Freq: Once | ORAL | Status: DC

## 2014-06-16 MED ORDER — REPHRESH CLEAN BALANCE VA KIT: PACK | VAGINAL | Status: DC

## 2014-06-16 NOTE — Patient Instructions (Signed)

## 2014-06-16 NOTE — Progress Notes (Signed)
Pt referred from [facility] because of recurrent BV and yeast. She states she has been treated with Metronidazole and also fluconazole daily.

## 2014-06-16 NOTE — Progress Notes (Signed)
 CLINIC ENCOUNTER NOTE  History:  49 y.o. G5P5005 here today for discussion about management of recurrent vaginitis episodes since 2009.  No current symptoms. She is very frustrated and self-conscious about these episodes; tired of taking mutliple courses of antibiotics.  Past Medical History  Diagnosis Date  . Asthma   . Anemia     Past Surgical History  Procedure Laterality Date  . Tubal ligation      The following portions of the patient's history were reviewed and updated as appropriate: allergies, current medications, past family history, past medical history, past social history, past surgical history and problem list.   Health Maintenance:  Normal pap earlier in the year.  Normal mammogram on 07/21/2013.   Review of Systems:  Pertinent items are noted in HPI. Comprehensive review of systems was otherwise negative.   Objective:  Physical Exam BP 106/69 mmHg  Pulse 96  Temp(Src) 98.4 F (36.9 C) (Oral)  Resp 20  Ht 5' 7" (1.702 m)  Wt 112 lb 3.2 oz (50.894 kg)  BMI 17.57 kg/m2  LMP 06/06/2014 (Exact Date) CONSTITUTIONAL: Well-developed, well-nourished female in no acute distress.  HENT:  Normocephalic, atraumatic, External right and left ear normal. Oropharynx is clear and moist EYES: Conjunctivae and EOM are normal. Pupils are equal, round, and reactive to light. No scleral icterus.  NECK: Normal range of motion, supple, no masses SKIN: Skin is warm and dry. No rash noted. Not diaphoretic. No erythema. No pallor. NEUROLGIC: Alert and oriented to person, place, and time. Normal reflexes, muscle tone coordination. No cranial nerve deficit noted. PSYCHIATRIC: Normal mood and affect. Normal behavior. Normal judgment and thought content. CARDIOVASCULAR: Normal heart rate noted, regular rhythm RESPIRATORY: Effort and breath sounds normal, no problems with respiration noted ABDOMEN: Soft, no distention noted.  No tenderness, rebound or guarding.  PELVIC: Normal appearing  external genitalia; normal appearing vaginal mucosa and cervix.  Normal appearing discharge.  Normal uterine size, no other palpable masses, no uterine or adnexal tenderness. MUSCULOSKELETAL: Normal range of motion. No edema and no tenderness.    Assessment & Plan:  Discussed etiology of recurrent vaginitis as it relates to vaginal pH balance.  Proper vulvar hygiene emphasized: discussed avoidance of perfumed soaps, detergents, lotions and any type of douches; in addition to wearing cotton underwear and no underwear at night.  Also recommended cleaning front to back, voiding and cleaning up after intercourse.   Will try RepHresh which is OTC to help re-establish proper pH balance. Also recommended Boric acid capsule, can be obtained from a compounding pharmacy.  If symptoms recur, she will benefit from extended metronidazole therapy, this was prescribed as needed with Diflucan also as needed.  Meds ordered this encounter  Medications  . Miscellaneous Vaginal Products (REPHRESH CLEAN BALANCE) KIT    Sig: Follow instructions on the package    Dispense:  1 kit    Refill:  5  . Boric Acid CRYS    Sig: Place 600 mg vaginally 2 (two) times a week.    Dispense:  500 g    Refill:  5  . metroNIDAZOLE (METROGEL) 0.75 % vaginal gel    Sig: Place 1 Applicatorful vaginally at bedtime. Apply one applicatorful to vagina at bedtime for 10 days, then twice a week for 6 months.    Dispense:  70 g    Refill:  5  . fluconazole (DIFLUCAN) 150 MG tablet    Sig: Take 1 tablet (150 mg total) by mouth once. Can take additional dose   three days later if symptoms persist    Dispense:  1 tablet    Refill:  3    Will follow up response   Routine preventative health maintenance measures emphasized.   Total face-to-face time with patient: 20 minutes. Over 50% of encounter was spent on counseling and coordination of care.   UGONNA  ANYANWU, MD, FACOG Attending Obstetrician & Gynecologist [facility]  

## 2014-12-14 ENCOUNTER — Ambulatory Visit (INDEPENDENT_AMBULATORY_CARE_PROVIDER_SITE_OTHER): Payer: Self-pay | Admitting: Advanced Practice Midwife

## 2014-12-14 ENCOUNTER — Encounter: Payer: Self-pay | Admitting: Advanced Practice Midwife

## 2014-12-14 VITALS — BP 113/71 | HR 82 | Temp 98.1°F | Ht 67.0 in | Wt 111.0 lb

## 2014-12-14 DIAGNOSIS — N760 Acute vaginitis: Secondary | ICD-10-CM

## 2014-12-14 DIAGNOSIS — B3731 Acute candidiasis of vulva and vagina: Secondary | ICD-10-CM

## 2014-12-14 DIAGNOSIS — L298 Other pruritus: Secondary | ICD-10-CM

## 2014-12-14 DIAGNOSIS — B373 Candidiasis of vulva and vagina: Secondary | ICD-10-CM

## 2014-12-14 DIAGNOSIS — N898 Other specified noninflammatory disorders of vagina: Secondary | ICD-10-CM

## 2014-12-14 MED ORDER — HYDROCORTISONE 1 % EX CREA: 1.0000 "application " | TOPICAL_CREAM | Freq: Two times a day (BID) | CUTANEOUS | Status: DC | PRN

## 2014-12-14 MED ORDER — TERCONAZOLE 0.4 % VA CREA
1.0000 | TOPICAL_CREAM | Freq: Every day | VAGINAL | Status: DC
Start: 2014-12-14 — End: 2016-10-15

## 2014-12-14 NOTE — Progress Notes (Signed)
  Subjective:    Vicki Andrews is a 49 y.o. female who presents for c/o vaginal and perineal itching and irritation.  Has been there for 1-2 weeks. Has tried OTC sprays which burned. Has been using Always brand pads/liners.  Current symptoms vaginal irritation: fairly severe, intolerable, rash in perineal region. Contraception: abstinence Menstrual History: OB History    Gravida Para Term Preterm AB TAB SAB Ectopic Multiple Living   5 5 5       5        Patient's last menstrual period was 12/05/2014.    The following portions of the patient's history were reviewed and updated as appropriate: allergies, current medications, past family history, past medical history, past social history, past surgical history and problem list.  Review of Systems Pertinent items are noted in HPI.    Objective:    BP 113/71 mmHg  Pulse 82  Temp(Src) 98.1 F (36.7 C) (Oral)  Ht 5\' 7"  (1.702 m)  Wt 111 lb (50.349 kg)  BMI 17.38 kg/m2  LMP 12/05/2014 General:   alert, cooperative and no distress  Lymph Nodes:   Cervical, supraclavicular, and axillary nodes normal.  Pelvis:  Vulva and vagina appear normal. Bimanual exam reveals normal uterus and adnexa. Clinical staff offered to be present for exam: yes  Initials: Perineal erethema noted with dryness to skin.  Covers area where pad touches skin.  Minimal vaginal discharge Wet prep collected  Cultures:  Wet Prep     Assessment:    Very low risk of STD exposure, Possible vaginal yeast, more likely contact dermatitis probably related to Always pads      Plan:     Discussed dermatitis vs yeast Rx Terazol 7 and cortisone cream for possible yeast Recommend change to other brand of unscented pad Follow up as needed

## 2014-12-14 NOTE — Patient Instructions (Addendum)

## 2014-12-15 ENCOUNTER — Telehealth: Payer: Self-pay | Admitting: General Practice

## 2014-12-15 ENCOUNTER — Other Ambulatory Visit: Payer: Self-pay | Admitting: Advanced Practice Midwife

## 2014-12-15 DIAGNOSIS — N760 Acute vaginitis: Secondary | ICD-10-CM

## 2014-12-15 LAB — WET PREP, GENITAL
Trich, Wet Prep: NONE SEEN
Yeast Wet Prep HPF POC: NONE SEEN

## 2014-12-15 MED ORDER — METRONIDAZOLE 500 MG PO TABS: 500.0000 mg | ORAL_TABLET | Freq: Two times a day (BID) | ORAL | Status: DC

## 2014-12-15 NOTE — Telephone Encounter (Signed)
Per Vicki BourgeoisMarie Andrews, patient has BV and medication has been sent to pharmacy. Called patient and informed her of results. Patient verbalized understanding and states she gets this all the time. Discussed recommendations for decreasing incidence of BV. Patient verbalized understanding and had no questions

## 2015-01-04 ENCOUNTER — Telehealth: Payer: Self-pay | Admitting: *Deleted

## 2015-01-04 DIAGNOSIS — B379 Candidiasis, unspecified: Secondary | ICD-10-CM

## 2015-01-04 DIAGNOSIS — T3695XA Adverse effect of unspecified systemic antibiotic, initial encounter: Principal | ICD-10-CM

## 2015-01-04 NOTE — Telephone Encounter (Signed)
Pt left message stating that the medication she was prescribed by Wynelle BourgeoisMarie Williams is not working. She would like to have the cream prescribed instead.

## 2015-01-06 NOTE — Telephone Encounter (Addendum)
Called Vicki Andrews and left a message that we are calling her back and need her to call us back with more information re;  What she wants.  Per chart review was prescribed terazol cream and hydrocortisone cream on 12/14/14 for yeast and or dermatitis. Then was called 12/15/14 and told to take flagyl for BV.  Need to see if she took the terazol cream and/or hydrocortisone and if she took the flagyl.

## 2015-01-10 MED ORDER — FLUCONAZOLE 150 MG PO TABS: 150.0000 mg | ORAL_TABLET | Freq: Once | ORAL | Status: DC

## 2015-01-10 NOTE — Telephone Encounter (Signed)
Called patient and she states after taking the flagyl she is still having itching. Told patient the flagyl was for a bacterial infection and it is possible the antibiotic caused a yeast infection. Informed patient of med sent to pharmacy for yeast infection. Patient verbalized understanding and had no questions

## 2015-03-07 ENCOUNTER — Emergency Department (HOSPITAL_COMMUNITY)
Admission: EM | Admit: 2015-03-07 | Discharge: 2015-03-07 | Disposition: A | Discharge status: Home / Self Care | Payer: Self-pay | Attending: Emergency Medicine | Admitting: Emergency Medicine

## 2015-03-07 ENCOUNTER — Encounter (HOSPITAL_COMMUNITY): Payer: Self-pay | Admitting: Emergency Medicine

## 2015-03-07 DIAGNOSIS — F17200 Nicotine dependence, unspecified, uncomplicated: Secondary | ICD-10-CM | POA: Insufficient documentation

## 2015-03-07 DIAGNOSIS — Z7952 Long term (current) use of systemic steroids: Secondary | ICD-10-CM | POA: Insufficient documentation

## 2015-03-07 DIAGNOSIS — Z862 Personal history of diseases of the blood and blood-forming organs and certain disorders involving the immune mechanism: Secondary | ICD-10-CM | POA: Insufficient documentation

## 2015-03-07 DIAGNOSIS — Z8619 Personal history of other infectious and parasitic diseases: Secondary | ICD-10-CM | POA: Insufficient documentation

## 2015-03-07 DIAGNOSIS — Z79899 Other long term (current) drug therapy: Secondary | ICD-10-CM | POA: Insufficient documentation

## 2015-03-07 DIAGNOSIS — K649 Unspecified hemorrhoids: Secondary | ICD-10-CM | POA: Insufficient documentation

## 2015-03-07 DIAGNOSIS — J45909 Unspecified asthma, uncomplicated: Secondary | ICD-10-CM | POA: Insufficient documentation

## 2015-03-07 HISTORY — DX: Other specified bacterial agents as the cause of diseases classified elsewhere: B96.89

## 2015-03-07 HISTORY — DX: Acute vaginitis: N76.0

## 2015-03-07 MED ORDER — HYDROCODONE-ACETAMINOPHEN 5-325 MG PO TABS
2.0000 | ORAL_TABLET | Freq: Once | ORAL | Status: AC
  Administered 2015-03-07: 2 via ORAL
  Filled 2015-03-07: qty 2

## 2015-03-07 MED ORDER — LIDOCAINE HCL 2 % EX GEL
1.0000 "application " | Freq: Once | CUTANEOUS | Status: AC
  Administered 2015-03-07: 1 via TOPICAL
  Filled 2015-03-07: qty 20

## 2015-03-07 MED ORDER — DOCUSATE SODIUM 100 MG PO CAPS: 100.0000 mg | ORAL_CAPSULE | Freq: Two times a day (BID) | ORAL | Status: AC

## 2015-03-07 MED ORDER — TRAMADOL HCL 50 MG PO TABS: 50.0000 mg | ORAL_TABLET | Freq: Four times a day (QID) | ORAL | Status: DC | PRN

## 2015-03-07 NOTE — ED Provider Notes (Signed)
CSN: [id number]     Arrival date & time 03/07/15  1048 History   First MD Initiated Contact with Patient 03/07/15 1311     Chief Complaint  Patient presents with  . Hemorrhoids     (Consider location/radiation/quality/duration/timing/severity/associated sxs/prior Treatment) HPI Vicki Andrews is a 50 y.o. female with a history of chronic hemorrhoids, comes in for evaluation of acute painful hemorrhoids. Patient reports she has been trying OTC medications since November to help with her symptoms, but this has been grossly ineffective. She reports seeing a Careers advisersurgeon for this in the past, but was told they are not large enough to operate on. She denies constipation, painful bowel movements, anal sex or any trauma to the region. No other modifying factors.  Past Medical History  Diagnosis Date  . Asthma   . Anemia   . BV (bacterial vaginosis)    Past Surgical History  Procedure Laterality Date  . Tubal ligation     Family History  Problem Relation Age of Onset  . Colon cancer Father   . Crohn's disease Father   . Diabetes Father   . Hypertension Mother   . Hypertension Sister    Social History  Substance Use Topics  . Smoking status: Current Every Day Smoker -- 0.25 packs/day for 12 years  . Smokeless tobacco: Never Used  . Alcohol Use: Yes     Comment: socially   OB History    Gravida Para Term Preterm AB TAB SAB Ectopic Multiple Living   5 5 5       5      Review of Systems A 10 point review of systems was completed and was negative except for pertinent positives and negatives as mentioned in the history of present illness     Allergies  Phenylephrine hcl and Witch hazel  Home Medications   Prior to Admission medications   Medication Sig Start Date End Date Taking? Authorizing Provider  clotrimazole-betamethasone (LOTRISONE) cream Apply 1 application topically 2 (two) times daily.   Yes Historical Provider, MD  metroNIDAZOLE (METROGEL) 0.75 % vaginal gel Place 1  Applicatorful vaginally at bedtime. Once a week for 6 weeks 01/03/15  Yes Historical Provider, MD  Multiple Vitamin (MULTIVITAMIN) capsule Take 1 capsule by mouth daily.   Yes Historical Provider, MD  docusate sodium (COLACE) 100 MG capsule Take 1 capsule (100 mg total) by mouth every 12 (twelve) hours. 03/07/15   Joycie PeekBenjamin Alistair Senft, PA-C  fluconazole (DIFLUCAN) 150 MG tablet Take 1 tablet (150 mg total) by mouth once. Patient not taking: Reported on 03/07/2015 01/10/15   Rhona RaiderJacob J Stinson, DO  hydrocortisone cream 1 % Apply 1 application topically 2 (two) times daily as needed for itching. Patient not taking: Reported on 03/07/2015 12/14/14   Aviva SignsMarie L Williams, CNM  metroNIDAZOLE (FLAGYL) 500 MG tablet Take 1 tablet (500 mg total) by mouth 2 (two) times daily. Patient not taking: Reported on 03/07/2015 12/15/14   Aviva SignsMarie L Williams, CNM  terconazole (TERAZOL 7) 0.4 % vaginal cream Place 1 applicator vaginally at bedtime. Patient not taking: Reported on 03/07/2015 12/14/14   Aviva SignsMarie L Williams, CNM  traMADol (ULTRAM) 50 MG tablet Take 1 tablet (50 mg total) by mouth every 6 (six) hours as needed. 03/07/15   Joycie PeekBenjamin Akeya Ryther, PA-C   BP 111/77 mmHg  Pulse 77  Temp(Src) 98.5 F (36.9 C) (Oral)  Resp 18  Ht 5\' 7"  (1.702 m)  Wt 50.803 kg  BMI 17.54 kg/m2  SpO2 99% Physical Exam  Constitutional: She is  oriented to person, place, and time. She appears well-developed and well-nourished.  HENT:  Head: Normocephalic and atraumatic.  Mouth/Throat: Oropharynx is clear and moist.  Eyes: Conjunctivae are normal. Pupils are equal, round, and reactive to light. Right eye exhibits no discharge. Left eye exhibits no discharge. No scleral icterus.  Neck: Neck supple.  Cardiovascular: Normal rate, regular rhythm and normal heart sounds.   Pulmonary/Chest: Effort normal and breath sounds normal. No respiratory distress. She has no wheezes. She has no rales.  Abdominal: Soft. There is no tenderness.  Genitourinary:   Chaperone present for rectal exam. There are multiple, nonthrombosed hemorrhoids around circumference of anus. No other fissures, bleeding, drainage or other concerning findings.  Musculoskeletal: She exhibits no tenderness.  Neurological: She is alert and oriented to person, place, and time.  Cranial Nerves II-XII grossly intact  Skin: Skin is warm and dry. No rash noted.  Psychiatric: She has a normal mood and affect.  Nursing note and vitals reviewed.   ED Course  Procedures (including critical care time) Labs Review Labs Reviewed - No data to display  Imaging Review No results found. I have personally reviewed and evaluated these images and lab results as part of my medical decision-making.   EKG Interpretation None     Meds given in ED:  Medications  HYDROcodone-acetaminophen (NORCO/VICODIN) 5-325 MG per tablet 2 tablet (2 tablets Oral Given 03/07/15 1421)  lidocaine (XYLOCAINE) 2 % jelly 1 application (1 application Topical Given 03/07/15 1422)    Discharge Medication List as of 03/07/2015  3:52 PM    START taking these medications   Details  docusate sodium (COLACE) 100 MG capsule Take 1 capsule (100 mg total) by mouth every 12 (twelve) hours., Starting 03/07/2015, Until Discontinued, Print    traMADol (ULTRAM) 50 MG tablet Take 1 tablet (50 mg total) by mouth every 6 (six) hours as needed., Starting 03/07/2015, Until Discontinued, Print       Filed Vitals:   03/07/15 1119 03/07/15 1122 03/07/15 1602  BP: 108/60  111/77  Pulse: 76  77  Temp: 98.5 F (36.9 C)    TempSrc: Oral    Resp: 18  18  Height:  5\' 7"  (1.702 m)   Weight:  50.803 kg   SpO2: 100%  99%    MDM  Vicki Andrews is a 50 y.o. female presents for acute hemorrhoidal pain. Pt has longstanding hx of hemorrhoids. On exam, non thrombosed hemorrhoids circumferentially around anus. Given lidocaine jelly in ED for comfort. Tramadol for pain at home. Also given Colace and instructions for high fiber diet,  sitz baths and other sx management at home. Given referral to Gen Surg for further eval. No evidence of other acute or emergent pathology at this time. Pt stable, appears well and appropriate for DC. Final diagnoses:  Hemorrhoids, unspecified hemorrhoid type        Joycie Peek, PA-C 03/08/15 1409  Vanetta Mulders, MD 03/10/15 1222

## 2015-03-07 NOTE — Discharge Instructions (Signed)
Please follow-up with general surgery for definitive care of your Hemorrhoids. You may take your pain medicines for severe pain. Continue taking Motrin and Tylenol for mild-to-moderate pain. You may take Colace to help soften stools. Return to ED for any new or worsening symptoms.  Hemorrhoids Hemorrhoids are swollen veins around the rectum or anus. There are two types of hemorrhoids:   Internal hemorrhoids. These occur in the veins just inside the rectum. They may poke through to the outside and become irritated and painful.  External hemorrhoids. These occur in the veins outside the anus and can be felt as a painful swelling or hard lump near the anus. CAUSES  Pregnancy.   Obesity.   Constipation or diarrhea.   Straining to have a bowel movement.   Sitting for long periods on the toilet.  Heavy lifting or other activity that caused you to strain.  Anal intercourse. SYMPTOMS   Pain.   Anal itching or irritation.   Rectal bleeding.   Fecal leakage.   Anal swelling.   One or more lumps around the anus.  DIAGNOSIS  Your caregiver may be able to diagnose hemorrhoids by visual examination. Other examinations or tests that may be performed include:   Examination of the rectal area with a gloved hand (digital rectal exam).   Examination of anal canal using a small tube (scope).   A blood test if you have lost a significant amount of blood.  A test to look inside the colon (sigmoidoscopy or colonoscopy). TREATMENT Most hemorrhoids can be treated at home. However, if symptoms do not seem to be getting better or if you have a lot of rectal bleeding, your caregiver may perform a procedure to help make the hemorrhoids get smaller or remove them completely. Possible treatments include:   Placing a rubber band at the base of the hemorrhoid to cut off the circulation (rubber band ligation).   Injecting a chemical to shrink the hemorrhoid (sclerotherapy).   Using a  tool to burn the hemorrhoid (infrared light therapy).   Surgically removing the hemorrhoid (hemorrhoidectomy).   Stapling the hemorrhoid to block blood flow to the tissue (hemorrhoid stapling).  HOME CARE INSTRUCTIONS   Eat foods with fiber, such as whole grains, beans, nuts, fruits, and vegetables. Ask your doctor about taking products with added fiber in them (fibersupplements).  Increase fluid intake. Drink enough water and fluids to keep your urine clear or pale yellow.   Exercise regularly.   Go to the bathroom when you have the urge to have a bowel movement. Do not wait.   Avoid straining to have bowel movements.   Keep the anal area dry and clean. Use wet toilet paper or moist towelettes after a bowel movement.   Medicated creams and suppositories may be used or applied as directed.   Only take over-the-counter or prescription medicines as directed by your caregiver.   Take warm sitz baths for 15-20 minutes, 3-4 times a day to ease pain and discomfort.   Place ice packs on the hemorrhoids if they are tender and swollen. Using ice packs between sitz baths may be helpful.   Put ice in a plastic bag.   Place a towel between your skin and the bag.   Leave the ice on for 15-20 minutes, 3-4 times a day.   Do not use a donut-shaped pillow or sit on the toilet for long periods. This increases blood pooling and pain.  SEEK MEDICAL CARE IF:  You have increasing pain and  swelling that is not controlled by treatment or medicine.  You have uncontrolled bleeding.  You have difficulty or you are unable to have a bowel movement.  You have pain or inflammation outside the area of the hemorrhoids. MAKE SURE YOU:  Understand these instructions.  Will watch your condition.  Will get help right away if you are not doing well or get worse.   This information is not intended to replace advice given to you by your health care provider. Make sure you discuss any  questions you have with your health care provider.   Document Released: 02/03/2000 Document Revised: 01/23/2012 Document Reviewed: 12/11/2011 Elsevier Interactive Patient Education 2016 Elsevier Inc.  High-Fiber Diet Fiber, also called dietary fiber, is a type of carbohydrate found in fruits, vegetables, whole grains, and beans. A high-fiber diet can have many health benefits. Your health care provider may recommend a high-fiber diet to help:  Prevent constipation. Fiber can make your bowel movements more regular.  Lower your cholesterol.  Relieve hemorrhoids, uncomplicated diverticulosis, or irritable bowel syndrome.  Prevent overeating as part of a weight-loss plan.  Prevent heart disease, type 2 diabetes, and certain cancers. WHAT IS MY PLAN? The recommended daily intake of fiber includes:  38 grams for men under age 74.  30 grams for men over age 11.  25 grams for women under age 38.  21 grams for women over age 43. You can get the recommended daily intake of dietary fiber by eating a variety of fruits, vegetables, grains, and beans. Your health care provider may also recommend a fiber supplement if it is not possible to get enough fiber through your diet. WHAT DO I NEED TO KNOW ABOUT A HIGH-FIBER DIET?  Fiber supplements have not been widely studied for their effectiveness, so it is better to get fiber through food sources.  Always check the fiber content on thenutrition facts label of any prepackaged food. Look for foods that contain at least 5 grams of fiber per serving.  Ask your dietitian if you have questions about specific foods that are related to your condition, especially if those foods are not listed in the following section.  Increase your daily fiber consumption gradually. Increasing your intake of dietary fiber too quickly may cause bloating, cramping, or gas.  Drink plenty of water. Water helps you to digest fiber. WHAT FOODS CAN I EAT? Grains Whole-grain  breads. Multigrain cereal. Oats and oatmeal. Brown rice. Barley. Bulgur wheat. Millet. Bran muffins. Popcorn. Rye wafer crackers. Vegetables Sweet potatoes. Spinach. Kale. Artichokes. Cabbage. Broccoli. Green peas. Carrots. Squash. Fruits Berries. Pears. Apples. Oranges. Avocados. Prunes and raisins. Dried figs. Meats and Other Protein Sources Navy, kidney, pinto, and soy beans. Split peas. Lentils. Nuts and seeds. Dairy Fiber-fortified yogurt. Beverages Fiber-fortified soy milk. Fiber-fortified orange juice. Other Fiber bars. The items listed above may not be a complete list of recommended foods or beverages. Contact your dietitian for more options. WHAT FOODS ARE NOT RECOMMENDED? Grains White bread. Pasta made with refined flour. White rice. Vegetables Fried potatoes. Canned vegetables. Well-cooked vegetables.  Fruits Fruit juice. Cooked, strained fruit. Meats and Other Protein Sources Fatty cuts of meat. Fried Environmental education officer or fried fish. Dairy Milk. Yogurt. Cream cheese. Sour cream. Beverages Soft drinks. Other Cakes and pastries. Butter and oils. The items listed above may not be a complete list of foods and beverages to avoid. Contact your dietitian for more information. WHAT ARE SOME TIPS FOR INCLUDING HIGH-FIBER FOODS IN MY DIET?  Eat a wide variety  of high-fiber foods.  Make sure that half of all grains consumed each day are whole grains.  Replace breads and cereals made from refined flour or white flour with whole-grain breads and cereals.  Replace white rice with brown rice, bulgur wheat, or millet.  Start the day with a breakfast that is high in fiber, such as a cereal that contains at least 5 grams of fiber per serving.  Use beans in place of meat in soups, salads, or pasta.  Eat high-fiber snacks, such as berries, raw vegetables, nuts, or popcorn.   This information is not intended to replace advice given to you by your health care provider. Make sure you discuss  any questions you have with your health care provider.   Document Released: 02/05/2005 Document Revised: 02/26/2014 Document Reviewed: 07/21/2013 Elsevier Interactive Patient Education [employer] Elsevier Inc.

## 2015-03-07 NOTE — ED Notes (Signed)
Onset 2 weeks rectal pain used multiple OTC medication for hemorrhoids and started vaginal creme given by Doctor in August 2016 bacterial vaginosis.

## 2016-10-15 ENCOUNTER — Encounter (HOSPITAL_COMMUNITY): Payer: Self-pay | Admitting: Emergency Medicine

## 2016-10-15 ENCOUNTER — Ambulatory Visit (HOSPITAL_COMMUNITY)
Admission: EM | Admit: 2016-10-15 | Discharge: 2016-10-15 | Disposition: A | Discharge status: Home / Self Care | Payer: Self-pay

## 2016-10-15 DIAGNOSIS — K644 Residual hemorrhoidal skin tags: Secondary | ICD-10-CM

## 2016-10-15 DIAGNOSIS — R21 Rash and other nonspecific skin eruption: Secondary | ICD-10-CM

## 2016-10-15 MED ORDER — HYDROCORTISONE ACE-PRAMOXINE 2.5-1 % RE CREA: 1.0000 "application " | TOPICAL_CREAM | Freq: Three times a day (TID) | RECTAL | 0 refills | Status: AC

## 2016-10-15 MED ORDER — CLOTRIMAZOLE-BETAMETHASONE 1-0.05 % EX CREA: TOPICAL_CREAM | CUTANEOUS | 1 refills | Status: DC

## 2016-10-15 NOTE — ED Provider Notes (Signed)
[facility]    CSN: [id number] Arrival date & time: 10/15/16  1001     History   Chief Complaint Chief Complaint  Patient presents with  . Hemorrhoids    HPI Vicki Andrews is a 51 y.o. female.   51 year old female presents with complaint of recurrent hemorrhoids and an itchy rash in the groin, suprapubic area, medial thighs and medial buttocks. She has had this rash off and on for over 2 years. She has not seen a PCP for this but has been to various providers and received different types of medications. She states none of these work. She was told by the health Department FNP that she may have lichen sclerosis. Complaining of external anal pain secondary to recurring hemorrhoids.      Past Medical History:  Diagnosis Date  . Anemia   . Asthma   . BV (bacterial vaginosis)     Patient Active Problem List   Diagnosis Date Noted  . Right bundle branch block 09/23/2012  . Candidal intertrigo 09/23/2012  . Recurrent vaginitis 09/23/2012  . Hemorrhoids 05/26/2012  . Family history of malignant neoplasm of gastrointestinal tract 04/09/2012  . HEMORRHOIDS 04/27/2010  . GASTRITIS 04/27/2010  . CORNS AND CALLUSES 04/27/2010  . 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 TOBACCO ABUSE 10/31/2006    Past Surgical History:  Procedure Laterality Date  . TUBAL LIGATION      OB History    Gravida Para Term Preterm AB Living   5 5 5     5    SAB TAB Ectopic Multiple Live Births                   Home Medications    Prior to Admission medications   Medication Sig Start Date End Date Taking? Authorizing Provider  Probiotic Product (PROBIOTIC PO) Take by mouth.   Yes [provider]  clotrimazole-betamethasone (LOTRISONE) cream Apply to affected area of skin 2 times daily prn 10/15/16   Hayden Rasmussen, NP  docusate sodium (COLACE) 100 MG capsule Take 1 capsule (100 mg total) by mouth every 12 (twelve) hours. 03/07/15   Cartner, Sharlet [place], PA-C  hydrocortisone-pramoxine  (ANALPRAM HC) 2.5-1 % rectal cream Place 1 application rectally 3 (three) times daily. 10/15/16   Hayden Rasmussen, NP  metroNIDAZOLE (METROGEL) 0.75 % vaginal gel Place 1 Applicatorful vaginally at bedtime. Once a week for 6 weeks 01/03/15   [provider]  Multiple Vitamin (MULTIVITAMIN) capsule Take 1 capsule by mouth daily.    [provider]  traMADol (ULTRAM) 50 MG tablet Take 1 tablet (50 mg total) by mouth every 6 (six) hours as needed. 03/07/15   Joycie Peek, PA-C    Family History Family History  Problem Relation Age of Onset  . Colon cancer Father   . Crohn's disease Father   . Diabetes Father   . Hypertension Mother   . Hypertension Sister     Social History Social History  Substance Use Topics  . Smoking status: Current Every Day Smoker    Packs/day: 0.25    Years: 12.00  . Smokeless tobacco: Never Used  . Alcohol use Yes     Comment: socially     Allergies   Phenylephrine hcl and Witch hazel [tucks]   Review of Systems Review of Systems  Constitutional: Negative.   HENT: Negative.   Respiratory: Negative.   Gastrointestinal: Negative.   Genitourinary: Negative for difficulty urinating, dysuria, menstrual problem, pelvic pain, vaginal bleeding and vaginal discharge.  Musculoskeletal:  Negative.   Skin: Positive for color change and rash.  Neurological: Negative.   All other systems reviewed and are negative.    Physical Exam Triage Vital Signs ED Triage Vitals  Enc Vitals Group     BP 10/15/16 1017 90/64     Pulse Rate 10/15/16 1017 94     Resp 10/15/16 1017 18     Temp 10/15/16 1017 98.2 F (36.8 C)     Temp Source 10/15/16 1017 Oral     SpO2 10/15/16 1017 97 %     Weight --      Height --      Head Circumference --      Peak Flow --      Pain Score 10/15/16 1014 10     Pain Loc --      Pain Edu? --      Excl. in GC? --    No data found.   Updated Vital Signs BP 90/64 (BP Location: Left Arm)   Pulse 94   Temp 98.2  F (36.8 C) (Oral)   Resp 18   LMP 09/19/2016   SpO2 97%   Visual Acuity Right Eye Distance:   Left Eye Distance:   Bilateral Distance:    Right Eye Near:   Left Eye Near:    Bilateral Near:     Physical Exam  Constitutional: She is oriented to person, place, and time. She appears well-developed and well-nourished. No distress.  Neck: Neck supple.  Pulmonary/Chest: Effort normal.  Genitourinary:  Genitourinary Comments: Mara, rad tech present for exam. There are just a few small areas of annular depigmented areas. The medial thighs contain rough skin colored patches which are itchy surrounding a few of the small patches. They are very pruritic. No bleeding or discharge. No draining. No signs of bacterial infection.  External rectal exam reveals external hemorrhoids and old hemorrhoidal tags. Palpation reveals tenderness but no hematoma appreciated.  Neurological: She is alert and oriented to person, place, and time.  Skin: Skin is warm.  Nursing note and vitals reviewed.    UC Treatments / Results  Labs (all labs ordered are listed, but only abnormal results are displayed) Labs Reviewed - No data to display  EKG  EKG Interpretation None       Radiology No results found.  Procedures Procedures (including critical care time)  Medications Ordered in UC Medications - No data to display   Initial Impression / Assessment and Plan / UC Course  I have reviewed the triage vital signs and the nursing notes.  Pertinent labs & imaging results that were available during my care of the patient were reviewed by me and considered in my medical decision making (see chart for details).     Apply the cream prescribed to the itchy skin. There is a no other cream called Analpram that she may apply to the hemorrhoid. Warm water soaks for sitz baths, take stool softeners 3 times a day. Make an appointment with your primary care provider soon as possible.   Final Clinical  Impressions(s) / UC Diagnoses   Final diagnoses:  Inflamed external hemorrhoid  Rash and nonspecific skin eruption    New Prescriptions New Prescriptions   CLOTRIMAZOLE-BETAMETHASONE (LOTRISONE) CREAM    Apply to affected area of skin 2 times daily prn   HYDROCORTISONE-PRAMOXINE (ANALPRAM HC) 2.5-1 % RECTAL CREAM    Place 1 application rectally 3 (three) times daily.     Controlled Substance Prescriptions [place] Controlled Substance Registry consulted?  Not Applicable   Hayden Rasmussen, NP 10/15/16 1047

## 2016-10-15 NOTE — Discharge Instructions (Signed)
Apply the cream prescribed to the itchy skin. There is a no other cream called Analpram that she may apply to the hemorrhoid. Warm water soaks for sitz baths, take stool softeners 3 times a day. Make an appointment with your primary care provider soon as possible.

## 2016-10-15 NOTE — ED Triage Notes (Signed)
Patient has a history of hemorrhoids.  Patient also has a rash patient has tried triamcinolone cream, emuaid, and neosporin and rash continues.  This rash itches and has been told she has lichen sclerosus by the health department.

## 2017-05-05 ENCOUNTER — Encounter: Payer: Self-pay | Admitting: Gastroenterology

## 2017-12-27 ENCOUNTER — Other Ambulatory Visit: Payer: Self-pay

## 2017-12-27 ENCOUNTER — Encounter (HOSPITAL_COMMUNITY): Payer: Self-pay | Admitting: Emergency Medicine

## 2017-12-27 ENCOUNTER — Emergency Department (HOSPITAL_COMMUNITY)
Admission: EM | Admit: 2017-12-27 | Discharge: 2017-12-27 | Disposition: A | Discharge status: Home / Self Care | Payer: Self-pay | Attending: Emergency Medicine | Admitting: Emergency Medicine

## 2017-12-27 ENCOUNTER — Emergency Department (HOSPITAL_COMMUNITY): Payer: Self-pay

## 2017-12-27 DIAGNOSIS — F17210 Nicotine dependence, cigarettes, uncomplicated: Secondary | ICD-10-CM | POA: Insufficient documentation

## 2017-12-27 DIAGNOSIS — R05 Cough: Secondary | ICD-10-CM | POA: Insufficient documentation

## 2017-12-27 DIAGNOSIS — R0789 Other chest pain: Secondary | ICD-10-CM | POA: Insufficient documentation

## 2017-12-27 DIAGNOSIS — J45909 Unspecified asthma, uncomplicated: Secondary | ICD-10-CM | POA: Insufficient documentation

## 2017-12-27 DIAGNOSIS — R079 Chest pain, unspecified: Secondary | ICD-10-CM

## 2017-12-27 LAB — BASIC METABOLIC PANEL
Anion gap: 5 (ref 5–15)
BUN: 11 mg/dL (ref 6–20)
CHLORIDE: 108 mmol/L (ref 98–111)
CO2: 25 mmol/L (ref 22–32)
CREATININE: 0.8 mg/dL (ref 0.44–1.00)
Calcium: 11.6 mg/dL — ABNORMAL HIGH (ref 8.9–10.3)
GFR calc non Af Amer: >60 mL/min (ref >60)
Glucose, Bld: 88 mg/dL (ref 70–99)
POTASSIUM: 3.3 mmol/L — AB (ref 3.5–5.1)
SODIUM: 138 mmol/L (ref 135–145)

## 2017-12-27 LAB — CBC
HEMATOCRIT: 42.3 % (ref 36.0–46.0)
HEMOGLOBIN: 12.9 g/dL (ref 12.0–15.0)
MCH: 30.6 pg (ref 26.0–34.0)
MCHC: 30.5 g/dL (ref 30.0–36.0)
MCV: 100.2 fL — AB (ref 80.0–100.0)
NRBC: 0 % (ref 0.0–0.2)
Platelets: 205 10*3/uL (ref 150–400)
RBC: 4.22 MIL/uL (ref 3.87–5.11)
RDW: 12.2 % (ref 11.5–15.5)
WBC: 8.1 10*3/uL (ref 4.0–10.5)

## 2017-12-27 LAB — I-STAT TROPONIN, ED
TROPONIN I, POC: 0 ng/mL (ref 0.00–0.08)
Troponin i, poc: 0 ng/mL (ref 0.00–0.08)

## 2017-12-27 LAB — I-STAT BETA HCG BLOOD, ED (MC, WL, AP ONLY)

## 2017-12-27 MED ORDER — IOPAMIDOL (ISOVUE-370) INJECTION 76%
100.0000 mL | Freq: Once | INTRAVENOUS | Status: AC | PRN
  Administered 2017-12-27: 100 mL via INTRAVENOUS

## 2017-12-27 MED ORDER — ASPIRIN 81 MG PO CHEW
324.0000 mg | CHEWABLE_TABLET | Freq: Once | ORAL | Status: AC
  Administered 2017-12-27: 324 mg via ORAL
  Filled 2017-12-27: qty 4

## 2017-12-27 MED ORDER — IOPAMIDOL (ISOVUE-370) INJECTION 76%
INTRAVENOUS | Status: AC
  Filled 2017-12-27: qty 100

## 2017-12-27 NOTE — ED Triage Notes (Signed)
Pt here for new onset chest pain  around 5pm to center chest radiating to the left arm. Pain is currently 8/10.

## 2017-12-27 NOTE — ED Provider Notes (Signed)
I saw and evaluated the patient, reviewed the resident's note and I agree with the findings and plan.  EKG: EKG Interpretation  Date/Time:  Friday December 27 2017 18:23:58 EST Ventricular Rate:  80 PR Interval:  160 QRS Duration: 128 QT Interval:  372 QTC Calculation: 429 R Axis:   74 Text Interpretation:  Normal sinus rhythm Right bundle branch block Abnormal ECG No significant change since last tracing Confirmed by Lorre Nick ([id number]) on 12/27/2017 7:21:32 PM 52 year old female presents with several weeks of intermittent chest pain which characterizes sharp and lasting for seconds to less than 5 minutes.  Pain is been slightly pleuritic in nature.  CT chest negative here for PE.  Heart score is 3.  Patient first troponin negative and will repeat and likely sent home with outpatient cardiology follow-up   Lorre Nick, MD 12/27/17 2043

## 2017-12-27 NOTE — ED Notes (Signed)
Pt returned from imaging.

## 2017-12-27 NOTE — ED Notes (Signed)
ED Provider at bedside. 

## 2017-12-27 NOTE — ED Notes (Signed)
Patient Alert and oriented to baseline. Stable and ambulatory to baseline. Patient verbalized understanding of the discharge instructions.  Patient belongings were taken by the patient.   

## 2017-12-27 NOTE — Discharge Instructions (Signed)
Please follow-up with your primary care physician in the next week for further evaluation.  As we spoke about during your visit you will need follow-up in the next 3 months in order to get a repeat CT in order to further evaluate the incidental lung nodules that were found on your CT during this visit.

## 2017-12-27 NOTE — ED Provider Notes (Signed)
[facility] EMERGENCY DEPARTMENT Provider Note   CSN: [id number] Arrival date & time: 12/27/17  1814      History   Chief Complaint Chief Complaint  Patient presents with  . Chest Pain    HPI Ginelle Bays is a 52 y.o. female.   Chest Pain   This is a new problem. The current episode started more than 1 week ago. The problem has been gradually improving. The pain is associated with breathing. The pain is present in the substernal region. The pain is at a severity of 8/10. The pain is severe. The quality of the pain is described as pressure-like and pleuritic. The pain radiates to the left arm and left shoulder. The symptoms are aggravated by deep breathing. Associated symptoms include cough (Chronic) and numbness (left arm). Pertinent negatives include no abdominal pain, no back pain, no fever, no palpitations, no shortness of breath and no vomiting. She has tried nothing for the symptoms. Risk factors include smoking/tobacco exposure.  Pertinent negatives for past medical history include no seizures.  Pertinent negatives for family medical history include: no CAD.  Procedure history is negative for cardiac catheterization.   Patient is a 52 year old female with a past medical history of emphysema and anemia who presents to the emergency department for evaluation of chest pain.  Patient states that approximately a few weeks.  States that she smoked a cigarette and approximately 20 minutes later had an acute recurrence of left-sided chest pain that felt pressure-like in nature and radiated down to her left arm.  States that it is been present constantly since the onset and is been getting somewhat better with time.  She continues to endorse chest pain upon arrival to the emergency department.  States the chest pain is worse with exertion and with deep breathing.  She denies any history of coronary artery disease or family history of coronary artery disease.  Does smoke 2  packs/day of cigarettes.  Denies any leg swelling or recent travel.  No associated diaphoresis or nausea.  Remaining review of systems is as below.  Past Medical History:  Diagnosis Date  . Anemia   . Asthma   . BV (bacterial vaginosis)     Patient Active Problem List   Diagnosis Date Noted  . Right bundle branch block 09/23/2012  . Candidal intertrigo 09/23/2012  . Recurrent vaginitis 09/23/2012  . Hemorrhoids 05/26/2012  . Family history of malignant neoplasm of gastrointestinal tract 04/09/2012  . HEMORRHOIDS 04/27/2010  . GASTRITIS 04/27/2010  . CORNS AND CALLUSES 04/27/2010  . 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 TOBACCO ABUSE 10/31/2006    Past Surgical History:  Procedure Laterality Date  . TUBAL LIGATION       OB History    Gravida  5   Para  5   Term  5   Preterm      AB      Living  5     SAB      TAB      Ectopic      Multiple      Live Births               Home Medications    Prior to Admission medications   Medication Sig Start Date End Date Taking? Authorizing Provider  clotrimazole-betamethasone (LOTRISONE) cream Apply to affected area of skin 2 times daily prn 10/15/16   Hayden Rasmussen, NP  docusate sodium (COLACE) 100 MG capsule Take 1 capsule (100 mg total) by  mouth every 12 (twelve) hours. 03/07/15   Cartner, Sharlet Salina, PA-C  hydrocortisone-pramoxine (ANALPRAM HC) 2.5-1 % rectal cream Place 1 application rectally 3 (three) times daily. 10/15/16   Hayden Rasmussen, NP  metroNIDAZOLE (METROGEL) 0.75 % vaginal gel Place 1 Applicatorful vaginally at bedtime. Once a week for 6 weeks 01/03/15   [provider]  Multiple Vitamin (MULTIVITAMIN) capsule Take 1 capsule by mouth daily.    [provider]  Probiotic Product (PROBIOTIC PO) Take by mouth.    [provider]  traMADol (ULTRAM) 50 MG tablet Take 1 tablet (50 mg total) by mouth every 6 (six) hours as needed. 03/07/15   Joycie Peek, PA-C    Family History Family  History  Problem Relation Age of Onset  . Colon cancer Father   . Crohn's disease Father   . Diabetes Father   . Hypertension Mother   . Hypertension Sister     Social History Social History   Tobacco Use  . Smoking status: Current Every Day Smoker    Packs/day: 0.25    Years: 12.00    Pack years: 3.00  . Smokeless tobacco: Never Used  Substance Use Topics  . Alcohol use: Yes    Comment: socially  . Drug use: No     Allergies   Phenylephrine hcl and Witch hazel [tucks]   Review of Systems Review of Systems  Constitutional: Negative for chills and fever.  HENT: Negative for ear pain and sore throat.   Eyes: Negative for pain and visual disturbance.  Respiratory: Positive for cough (Chronic). Negative for shortness of breath.   Cardiovascular: Positive for chest pain. Negative for palpitations.  Gastrointestinal: Negative for abdominal pain and vomiting.  Genitourinary: Negative for dysuria and hematuria.  Musculoskeletal: Negative for arthralgias and back pain.  Skin: Negative for color change and rash.  Neurological: Positive for numbness (left arm). Negative for seizures and syncope.  All other systems reviewed and are negative.    Physical Exam Updated Vital Signs BP 107/71   Pulse 91   Temp 98.3 F (36.8 C) (Oral)   Resp 20   Ht 5\' 7"  (1.702 m)   Wt 49.9 kg   LMP 10/27/2017   SpO2 98%   BMI 17.23 kg/m   Physical Exam  Constitutional: She appears well-developed and well-nourished. No distress.  HENT:  Head: Normocephalic and atraumatic.  Eyes: Conjunctivae are normal.  Neck: Neck supple.  Cardiovascular: Normal rate and regular rhythm.  No murmur heard. Pulmonary/Chest: Effort normal. No respiratory distress.  Some scattered wheezing on auscultation.   Abdominal: Soft. There is no tenderness.  Musculoskeletal: She exhibits no edema.  Neurological: She is alert.  Skin: Skin is warm and dry.  Psychiatric: She has a normal mood and affect.    Nursing note and vitals reviewed.    ED Treatments / Results  Labs (all labs ordered are listed, but only abnormal results are displayed) Labs Reviewed  BASIC METABOLIC PANEL - Abnormal; Notable for the following components:      Result Value   Potassium 3.3 (*)    Calcium 11.6 (*)    All other components within normal limits  CBC - Abnormal; Notable for the following components:   MCV 100.2 (*)    All other components within normal limits  I-STAT TROPONIN, ED  I-STAT BETA HCG BLOOD, ED ([facility], [facility], [facility] ONLY)  I-STAT TROPONIN, ED    EKG EKG Interpretation  Date/Time:  Friday December 27 2017 18:23:58 EST Ventricular Rate:  80  PR Interval:  160 QRS Duration: 128 QT Interval:  372 QTC Calculation: 429 R Axis:   74 Text Interpretation:  Normal sinus rhythm Right bundle branch block Abnormal ECG No significant change since last tracing Confirmed by Lorre Nick ([id number]) on 12/27/2017 7:04:33 PM   Radiology Dg Chest 2 View  Result Date: 12/27/2017 CLINICAL DATA:  Chest pain. EXAM: CHEST - 2 VIEW COMPARISON:  Radiographs of April 20, 2006. FINDINGS: The heart size and mediastinal contours are within normal limits. Both lungs are clear. No pneumothorax or pleural effusion is noted. The visualized skeletal structures are unremarkable. IMPRESSION: No active cardiopulmonary disease. Electronically Signed   By: Lupita Raider, M.D.   On: 12/27/2017 20:17   Ct Angio Chest Pe W And/or Wo Contrast  Result Date: 12/27/2017 CLINICAL DATA:  Chest pain and left arm pain since 5 p.m. EXAM: CT ANGIOGRAPHY CHEST WITH CONTRAST TECHNIQUE: Multidetector CT imaging of the chest was performed using the standard protocol during bolus administration of intravenous contrast. Multiplanar CT image reconstructions and MIPs were obtained to evaluate the vascular anatomy. CONTRAST:  ISOVUE-370 IOPAMIDOL (ISOVUE-370) INJECTION 76% COMPARISON:  Chest x-ray 04/20/2006 FINDINGS: Cardiovascular: The heart is  normal in size. No pericardial effusion. The aorta is normal in caliber. No dissection. The branch vessels are normal. The pulmonary arterial tree is well opacified. No filling defects to suggest pulmonary embolism. Mediastinum/Nodes: No mediastinal or hilar mass or lymphadenopathy. Thick walled esophagus in the mid distal aspects likely suggesting esophagitis and possibly accounting for the patient's chest pain. No hiatal hernia. Lungs/Pleura: There are mild emphysematous changes suggesting the patient is a smoker. There are several nodular lesions in both lungs, some of which have ground-glass opacity. 4 mm left apical nodule on image 14. 4 mm sub solid nodule in the right upper lobe on image number 36. 6 mm ground-glass nodule in the right upper lobe on image number 38. 4 mm sub solid nodule in the left upper lobe on image number 44. Lobulated nodular density in the lingula on image number 78 measures 11 x 12 mm. On the sagittal images this appears to be at the end of a vessel and is most likely a focus of rounded atelectasis. Patchy ground-glass opacity in the right lower lobe is probably atelectasis or inflammation. Similar finding in the lingula. Upper Abdomen: No significant findings. Musculoskeletal: No breast masses, supraclavicular or axillary adenopathy. The bony thorax is intact.  No worrisome bone lesions. Review of the MIP images confirms the above findings. IMPRESSION: 1. No CT findings for pulmonary embolism. 2. Normal thoracic aorta. 3. Thick-walled esophagus suggesting esophagitis and possibly explaining the patient's chest pain. Recommend clinical correlation. 4. Mild emphysematous changes. 5. Multiple pulmonary lesions as detailed above. Recommend follow-up noncontrast chest CT in 3 months. Emphysema (ICD10-J43.9). Electronically Signed   By: Rudie Meyer M.D.   On: 12/27/2017 20:06    Procedures Procedures (including critical care time)  Medications Ordered in ED Medications  iopamidol  (ISOVUE-370) 76 % injection (has no administration in time range)  aspirin chewable tablet 324 mg (324 mg Oral Given 12/27/17 1859)  iopamidol (ISOVUE-370) 76 % injection 100 mL (100 mLs Intravenous Contrast Given 12/27/17 1947)     Initial Impression / Assessment and Plan / ED Course  I have reviewed the triage vital signs and the nursing notes.  Pertinent labs & imaging results that were available during my care of the patient were reviewed by me and considered in my medical decision making (see  chart for details).     Patient is a 52 year old female with a past medical history as detailed above who presents to the emergency department for evaluation of chest pain.  Patient initially reported that the chest pain was acute in onset, after further review patient reports that she has had intermittent chest pain for the past several weeks.  Given patient's presenting complaint and history a chest pain work-up was initiated while in the emergency department.  Patient's initial and delta troponin were both negative.  CTA of the patient's chest was obtained which shows no evidence of acute PE, multiple nodules, thickened esophagus, and mild emphysematous changes.  Trial right bundle branch block but no other evidence of acute ischemia.  Remaining labs are overall reassuring.  At this time I feel patient's chest pain is most likely related to inflammatory changes caused by her 2 pack/day smoking.  There is no evidence of ACS (HEART Score of 3), PE, pneumonia, or other emergent causes of chest pain on patient's extensive emergency department work-up.  As result patient is appropriate for discharge at this time with close outpatient follow-up.  Prior to discharge I gave patient the results of her CT including the presence of multiple pulmonary nodules that will require follow-up in 3 months.  She verbalizes understanding with the need for follow-up and will do so as an outpatient.  Patient given very strict  return precautions prior to discharge should her symptoms acutely worsen or if any new, concerning symptoms arise.  The care of this patient was discussed with my attending physician Dr. Freida Busman, who voices agreement with work-up and ED disposition.  Final Clinical Impressions(s) / ED Diagnoses   Final diagnoses:  Nonspecific chest pain    ED Discharge Orders    None       Neizan Debruhl, Winfield Rast, MD 12/28/17 1627    Lorre Nick, MD 12/28/17 2320

## 2018-10-13 ENCOUNTER — Other Ambulatory Visit: Payer: Self-pay

## 2018-10-13 ENCOUNTER — Ambulatory Visit (HOSPITAL_COMMUNITY)
Admission: EM | Admit: 2018-10-13 | Discharge: 2018-10-13 | Disposition: A | Discharge status: Home / Self Care | Payer: Medicaid Other | Attending: Emergency Medicine | Admitting: Emergency Medicine

## 2018-10-13 ENCOUNTER — Encounter (HOSPITAL_COMMUNITY): Payer: Self-pay

## 2018-10-13 DIAGNOSIS — B9689 Other specified bacterial agents as the cause of diseases classified elsewhere: Secondary | ICD-10-CM | POA: Insufficient documentation

## 2018-10-13 DIAGNOSIS — B356 Tinea cruris: Secondary | ICD-10-CM | POA: Insufficient documentation

## 2018-10-13 DIAGNOSIS — N760 Acute vaginitis: Secondary | ICD-10-CM | POA: Insufficient documentation

## 2018-10-13 MED ORDER — METRONIDAZOLE 0.75 % VA GEL
VAGINAL | 0 refills | Status: AC
Start: 2018-10-13 — End: ?

## 2018-10-13 MED ORDER — KETOCONAZOLE 2 % EX CREA: 1.0000 "application " | TOPICAL_CREAM | Freq: Every day | CUTANEOUS | 1 refills | Status: AC

## 2018-10-13 MED ORDER — FLUCONAZOLE 150 MG PO TABS: 150.0000 mg | ORAL_TABLET | Freq: Once | ORAL | 0 refills | Status: AC

## 2018-10-13 NOTE — ED Provider Notes (Signed)
HPI  SUBJECTIVE:  Vicki Andrews is a 53 y.o. female who presents with 2 months of an itchy rash in her groin labia and perineal area , vaginal discharge, labial erythema and  swelling, dysuria.  No vaginal odor, no other urinary complaints.  No genital blisters, ulcers.  States that she has been scratching herself extensively.  No abdominal pain.  Reports mild bilateral and pelvic pain for the past 2 months.  States that she has tried Monistat without improvement in her symptoms.  Also tried Neosporin, a and D ointment, Epsom salt baths, which hazel which seem to help.  Symptoms are worse with sweating, when she gets hot.  She has tried eczema cream, cortisone 10 with worsening of her symptoms.  No recent antibiotics, perfumed soaps or body washes.  She has not been sexually active in 2-1/2 years.  She states this is identical to previous episodes of BV.  She has a remote history of gonorrhea, questionable history of HSV, denies ever having any outbreaks.  She gets frequent BV and yeast infections.  No history of chlamydia, HIV, trichomonas, syphilis, atrophic vaginitis, diabetes, PID, tinea.  PMD: Health department.    Past Medical History:  Diagnosis Date  . Anemia   . Asthma   . BV (bacterial vaginosis)     Past Surgical History:  Procedure Laterality Date  . TUBAL LIGATION      Family History  Problem Relation Age of Onset  . Colon cancer Father   . Crohn's disease Father   . Diabetes Father   . Hypertension Mother   . Hypertension Sister     Social History   Tobacco Use  . Smoking status: Current Every Day Smoker    Packs/day: 0.25    Years: 12.00    Pack years: 3.00  . Smokeless tobacco: Never Used  Substance Use Topics  . Alcohol use: Yes    Comment: socially  . Drug use: No    No current facility-administered medications for this encounter.   Current Outpatient Medications:  .  docusate sodium (COLACE) 100 MG capsule, Take 1 capsule (100 mg total) by mouth every  12 (twelve) hours., Disp: 30 capsule, Rfl: 0 .  fluconazole (DIFLUCAN) 150 MG tablet, Take 1 tablet (150 mg total) by mouth once for 1 dose. 1 tab once a week for 2 weeks., Disp: 2 tablet, Rfl: 0 .  hydrocortisone-pramoxine (ANALPRAM HC) 2.5-1 % rectal cream, Place 1 application rectally 3 (three) times daily., Disp: 30 g, Rfl: 0 .  ketoconazole (NIZORAL) 2 % cream, Apply 1 application topically daily. Apply bid until rash resolved and for 1 week after, Disp: 30 g, Rfl: 1 .  metroNIDAZOLE (METROGEL) 0.75 % vaginal gel, 1 applicator full pv bid x 5 days, Disp: 70 g, Rfl: 0 .  Multiple Vitamin (MULTIVITAMIN) capsule, Take 1 capsule by mouth daily., Disp: , Rfl:  .  Probiotic Product (PROBIOTIC PO), Take by mouth., Disp: , Rfl:   Allergies  Allergen Reactions  . Phenylephrine Hcl Swelling    irritation  . Witch Hazel [Tucks] Swelling    irritation     ROS  As noted in HPI.   Physical Exam  BP 91/62 (BP Location: Right Arm)   Pulse 65   Temp 98.2 F (36.8 C) (Oral)   Resp 18   Wt 49.9 kg   LMP 10/10/2018   SpO2 100%   BMI 17.23 kg/m   Constitutional: Well developed, well nourished, no acute distress Eyes:  EOMI, conjunctiva normal  bilaterally HENT: Normocephalic, atraumatic,mucus membranes moist Respiratory: Normal inspiratory effort Cardiovascular: Normal rate GI: nondistended soft, nontender. No suprapubic tenderness  back: No CVA tenderness GU: Symmetric hyperpigmented scaly rash in groin, perineum, over bilateral labia.  No ulcers, blisters.  Some excoriations.  Normal vaginal mucosa.  Normal os. Thin nonoderous yellowish vaginal discharge. Uterus smooth,  NT. No CMT. No  adnexal tenderness. No adnexal masses.  Chaperone present during exam skin: No rash, skin intact Musculoskeletal: no deformities Neurologic: Alert & oriented x 3, no focal neuro deficits Psychiatric: Speech and behavior appropriate   ED Course   Medications - No data to display  No orders of the  defined types were placed in this encounter.   No results found for this or any previous visit (from the past 24 hour(s)). No results found.  ED Clinical Impression  1. BV (bacterial vaginosis)   2. Tinea cruris      ED Assessment/Plan  H&P most c/w  BV and tinea cruris. Sent off GC/chlamydia, wet prep. Will not treat empirically now for STD's.  Will treat the BV with metronidazole vaginal gel, and the tinea cruris with ketoconazole 2% topical twice daily for 4 weeks and for 1 week after the symptoms have resolved and Diflucan 150 mg once a week for 2 weeks given the extent of the symptoms.  Will provide primary care list for routine care. Pt provided working phone number. Follow-up with PMD of choice as needed. Discussed labs, MDM, plan and followup with patient. Pt agrees with plan.   Meds ordered this encounter  Medications  . metroNIDAZOLE (METROGEL) 0.75 % vaginal gel    Sig: 1 applicator full pv bid x 5 days    Dispense:  70 g    Refill:  0  . ketoconazole (NIZORAL) 2 % cream    Sig: Apply 1 application topically daily. Apply bid until rash resolved and for 1 week after    Dispense:  30 g    Refill:  1  . fluconazole (DIFLUCAN) 150 MG tablet    Sig: Take 1 tablet (150 mg total) by mouth once for 1 dose. 1 tab once a week for 2 weeks.    Dispense:  2 tablet    Refill:  0    *This clinic note was created using [employer]. Therefore, there may be occasional mistakes despite careful proofreading.  ?    Melynda Ripple, MD 10/14/18 [phone]

## 2018-10-13 NOTE — ED Triage Notes (Signed)
Pt states she has vaginal; irritation x 2 months. Pt states she has tried the yeast meds and they are not working.

## 2018-10-13 NOTE — Discharge Instructions (Addendum)
You appear to have a fungal infection on the outside.  Apply the ketoconazole twice a day for 4 weeks or until the symptoms resolve and for 1 week thereafter.  Take the Diflucan once a week for 2 weeks.  Sitz baths if urination is painful.  I also suspect that you have BV.  Treating this with Flagyl gel.  Finish this, even if you feel better.  Give [place] a working phone number so we can contact you if we need to change anything.  We have sent off testing for gonorrhea, chlamydia, BV, yeast, trichomonas.  Below is a list of primary care practices who are taking new patients for you to follow-up with.  [facility] at [facility] [address] [place], [place] [address] [phone]  [facility] [place], [place] [address] [phone]  [facility] [phone] [place] [address]  [facility]: [place]  [phone]  [place] [facility]: [place]   [phone]  [facility]: [address] [place]  [phone]  [place] [facility] : [address] [place] [phone]  [facility]: [address] [place] [address] [phone]  [facility]: [place] [phone]  Dr. Blanchie Serve [place]  [phone]  Dr. Benito Mccreedy, [facility]. [place], [place] [address]  [phone]  Go to www.goodrx.com to look up your medications. This will give you a list of where you can find your prescriptions at the most affordable prices. Or ask the pharmacist  what the cash price is, or if they have any other discount programs available to help make your medication more affordable. This can be less expensive than what you would pay with insurance.

## 2018-10-15 LAB — CERVICOVAGINAL ANCILLARY ONLY
Bacterial vaginitis: NEGATIVE
Candida vaginitis: NEGATIVE
Chlamydia: NEGATIVE
Neisseria Gonorrhea: NEGATIVE
Trichomonas: NEGATIVE
